# Patient Record
Sex: Female | Born: 1935 | Race: White | Hispanic: No | Marital: Married
Health system: Southern US, Community
[De-identification: ages and names within clinical notes are randomized; demographics above are authoritative.]

## PROBLEM LIST (undated history)

## (undated) DIAGNOSIS — S065XAA Traumatic subdural hemorrhage with loss of consciousness status unknown, initial encounter: Secondary | ICD-10-CM

## (undated) DIAGNOSIS — D649 Anemia, unspecified: Secondary | ICD-10-CM

## (undated) DIAGNOSIS — I509 Heart failure, unspecified: Secondary | ICD-10-CM

## (undated) DIAGNOSIS — I10 Essential (primary) hypertension: Secondary | ICD-10-CM

## (undated) HISTORY — DX: Anemia, unspecified: D64.9

## (undated) HISTORY — DX: Traumatic subdural hemorrhage with loss of consciousness status unknown, initial encounter: S06.5XAA

## (undated) HISTORY — DX: Heart failure, unspecified: I50.9

---

## 2012-05-12 ENCOUNTER — Ambulatory Visit: Payer: Self-pay | Admitting: Internal Medicine

## 2021-06-29 ENCOUNTER — Emergency Department: Payer: Medicare Other

## 2021-06-29 ENCOUNTER — Inpatient Hospital Stay
Admission: EM | Admit: 2021-06-29 | Discharge: 2021-07-01 | DRG: 083 | Disposition: A | Discharge status: Home Health | Payer: Medicare Other | Attending: Internal Medicine | Admitting: Internal Medicine

## 2021-06-29 ENCOUNTER — Encounter: Payer: Self-pay | Admitting: Internal Medicine

## 2021-06-29 DIAGNOSIS — I6200 Nontraumatic subdural hemorrhage, unspecified: Secondary | ICD-10-CM | POA: Diagnosis not present

## 2021-06-29 DIAGNOSIS — I10 Essential (primary) hypertension: Secondary | ICD-10-CM

## 2021-06-29 DIAGNOSIS — Z823 Family history of stroke: Secondary | ICD-10-CM

## 2021-06-29 DIAGNOSIS — Z87891 Personal history of nicotine dependence: Secondary | ICD-10-CM

## 2021-06-29 DIAGNOSIS — Y92009 Unspecified place in unspecified non-institutional (private) residence as the place of occurrence of the external cause: Secondary | ICD-10-CM | POA: Diagnosis not present

## 2021-06-29 DIAGNOSIS — D72829 Elevated white blood cell count, unspecified: Secondary | ICD-10-CM | POA: Diagnosis present

## 2021-06-29 DIAGNOSIS — W19XXXA Unspecified fall, initial encounter: Secondary | ICD-10-CM

## 2021-06-29 DIAGNOSIS — G8194 Hemiplegia, unspecified affecting left nondominant side: Secondary | ICD-10-CM | POA: Diagnosis present

## 2021-06-29 DIAGNOSIS — R29810 Facial weakness: Secondary | ICD-10-CM | POA: Diagnosis present

## 2021-06-29 DIAGNOSIS — S066XAA Traumatic subarachnoid hemorrhage with loss of consciousness status unknown, initial encounter: Secondary | ICD-10-CM | POA: Diagnosis not present

## 2021-06-29 DIAGNOSIS — Z79899 Other long term (current) drug therapy: Secondary | ICD-10-CM

## 2021-06-29 DIAGNOSIS — R4701 Aphasia: Secondary | ICD-10-CM | POA: Diagnosis present

## 2021-06-29 DIAGNOSIS — I609 Nontraumatic subarachnoid hemorrhage, unspecified: Secondary | ICD-10-CM

## 2021-06-29 DIAGNOSIS — S065XAA Traumatic subdural hemorrhage with loss of consciousness status unknown, initial encounter: Secondary | ICD-10-CM | POA: Diagnosis present

## 2021-06-29 DIAGNOSIS — Z66 Do not resuscitate: Secondary | ICD-10-CM | POA: Diagnosis present

## 2021-06-29 DIAGNOSIS — R471 Dysarthria and anarthria: Secondary | ICD-10-CM | POA: Diagnosis present

## 2021-06-29 DIAGNOSIS — R569 Unspecified convulsions: Secondary | ICD-10-CM

## 2021-06-29 DIAGNOSIS — R4182 Altered mental status, unspecified: Secondary | ICD-10-CM | POA: Diagnosis present

## 2021-06-29 DIAGNOSIS — E871 Hypo-osmolality and hyponatremia: Secondary | ICD-10-CM

## 2021-06-29 DIAGNOSIS — Z20822 Contact with and (suspected) exposure to covid-19: Secondary | ICD-10-CM | POA: Diagnosis present

## 2021-06-29 HISTORY — DX: Essential (primary) hypertension: I10

## 2021-06-29 LAB — CBC
HCT: 34 % — ABNORMAL LOW (ref 36.0–46.0)
Hemoglobin: 11 g/dL — ABNORMAL LOW (ref 12.0–15.0)
MCH: 27.8 pg (ref 26.0–34.0)
MCHC: 32.4 g/dL (ref 30.0–36.0)
MCV: 86.1 fL (ref 80.0–100.0)
Platelets: 376 10*3/uL (ref 150–400)
RBC: 3.95 MIL/uL (ref 3.87–5.11)
RDW: 15.1 % (ref 11.5–15.5)
WBC: 11.4 10*3/uL — ABNORMAL HIGH (ref 4.0–10.5)
nRBC: 0 % (ref 0.0–0.2)

## 2021-06-29 LAB — COMPREHENSIVE METABOLIC PANEL
ALT: 6 U/L (ref 0–44)
AST: 27 U/L (ref 15–41)
Albumin: 3 g/dL — ABNORMAL LOW (ref 3.5–5.0)
Alkaline Phosphatase: 69 U/L (ref 38–126)
Anion gap: 9 (ref 5–15)
BUN: 26 mg/dL — ABNORMAL HIGH (ref 8–23)
CO2: 25 mmol/L (ref 22–32)
Calcium: 9.5 mg/dL (ref 8.9–10.3)
Chloride: 98 mmol/L (ref 98–111)
Creatinine, Ser: 0.67 mg/dL (ref 0.44–1.00)
GFR, Estimated: >60 mL/min (ref >60)
Glucose, Bld: 130 mg/dL — ABNORMAL HIGH (ref 70–99)
Potassium: 5.1 mmol/L (ref 3.5–5.1)
Sodium: 132 mmol/L — ABNORMAL LOW (ref 135–145)
Total Bilirubin: 0.9 mg/dL (ref 0.3–1.2)
Total Protein: 8.6 g/dL — ABNORMAL HIGH (ref 6.5–8.1)

## 2021-06-29 LAB — DIFFERENTIAL
Abs Immature Granulocytes: 0.05 10*3/uL (ref 0.00–0.07)
Basophils Absolute: 0.1 10*3/uL (ref 0.0–0.1)
Basophils Relative: 1 %
Eosinophils Absolute: 0.1 10*3/uL (ref 0.0–0.5)
Eosinophils Relative: 1 %
Immature Granulocytes: 0 %
Lymphocytes Relative: 25 %
Lymphs Abs: 2.9 10*3/uL (ref 0.7–4.0)
Monocytes Absolute: 1.6 10*3/uL — ABNORMAL HIGH (ref 0.1–1.0)
Monocytes Relative: 14 %
Neutro Abs: 6.7 10*3/uL (ref 1.7–7.7)
Neutrophils Relative %: 59 %

## 2021-06-29 LAB — PROTIME-INR
INR: 1.2 (ref 0.8–1.2)
Prothrombin Time: 14.9 seconds (ref 11.4–15.2)

## 2021-06-29 LAB — APTT: aPTT: 40 seconds — ABNORMAL HIGH (ref 24–36)

## 2021-06-29 IMAGING — CT CT HEAD CODE STROKE
4 series · 16 of 47 positions shown, 18 images · non-contrast
Comparison: No pertinent prior exams available for comparison.

CLINICAL DATA: Code stroke. Neuro deficit, acute, stroke suspected.
Additional history provided: Slurred speech.

EXAM:
CT HEAD WITHOUT CONTRAST
TECHNIQUE: Contiguous axial images were obtained from the base of the skull
through the vertex without intravenous contrast.

[Series 3: head bone · axial · 0.41mm/px · z∈[+432,+462]mm · 3 of 76 slices shown]
[im 8/76  bone]
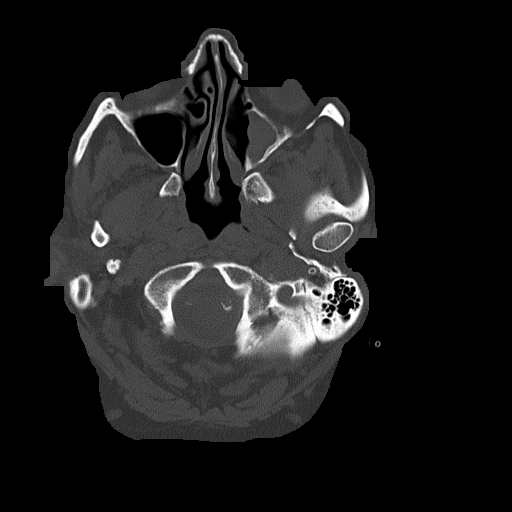
[im 16/76  bone]
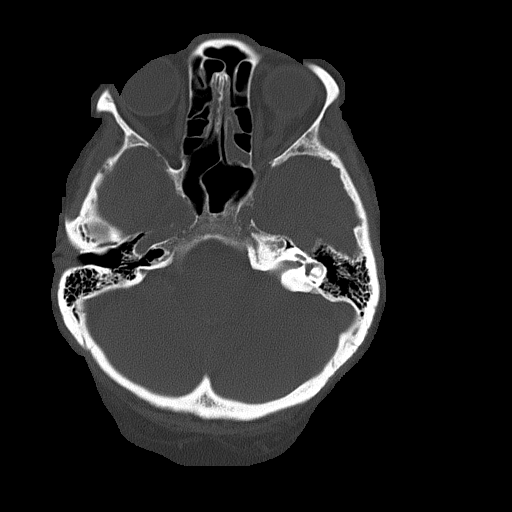
[im 23/76  bone]
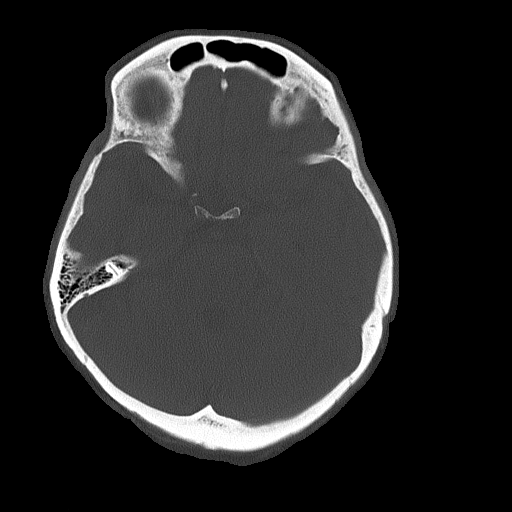

[Series 4: head wo · axial · 0.41mm/px · z∈[+433,+548]mm · 7 of 31 slices shown, 9 images]
[im 4/31  brain]
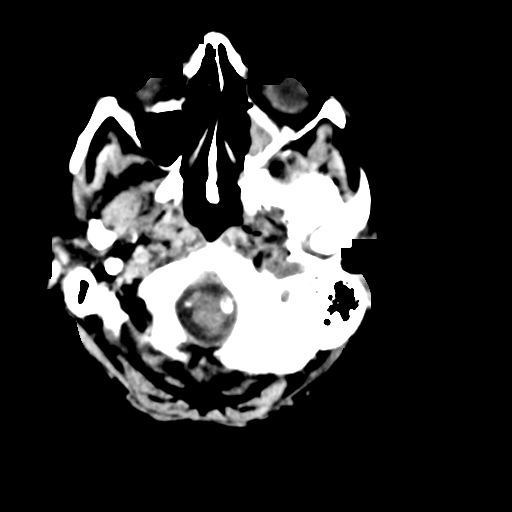
[im 4/31  bone]
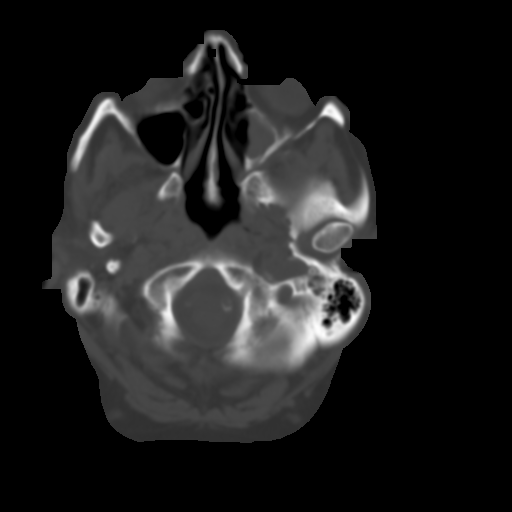
[im 8/31  brain]
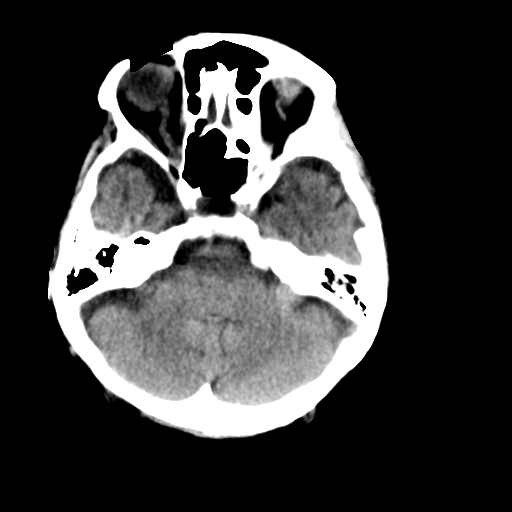
[im 12/31  brain]
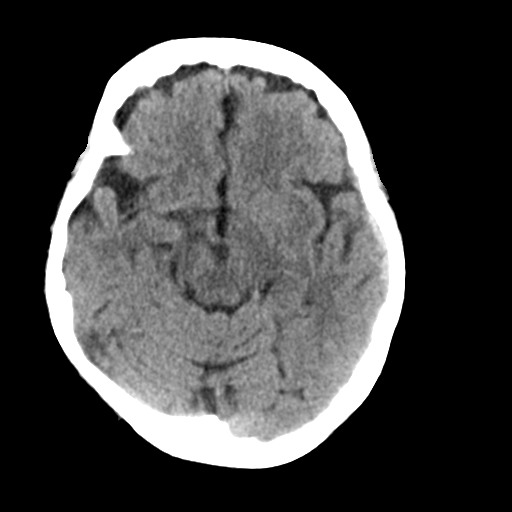
[im 16/31  brain]
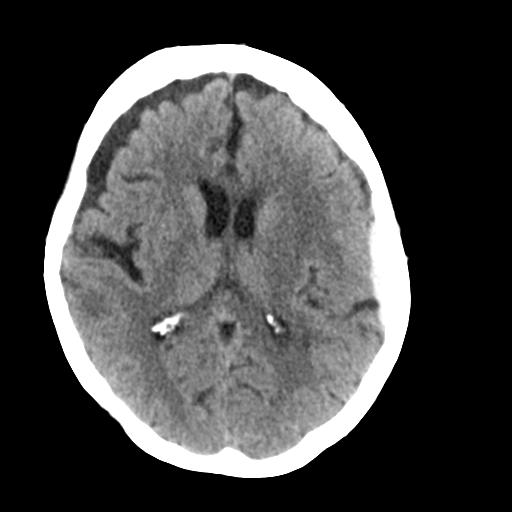
[im 19/31  brain]
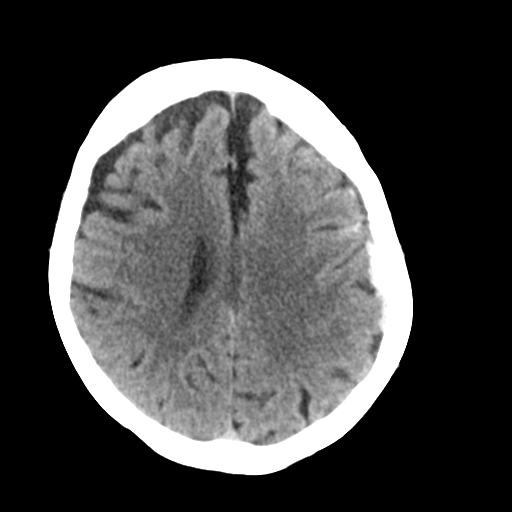
[im 19/31  bone]
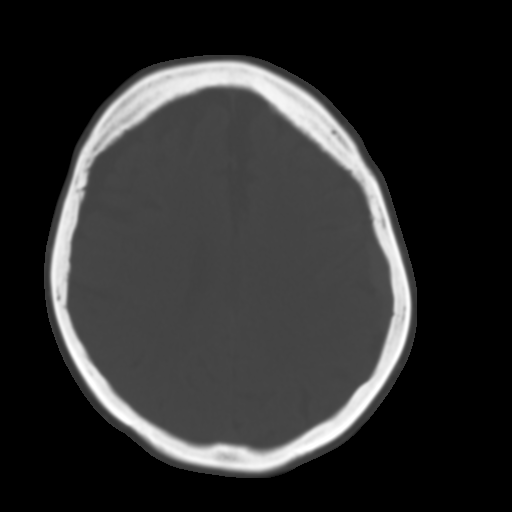
[im 23/31  brain]
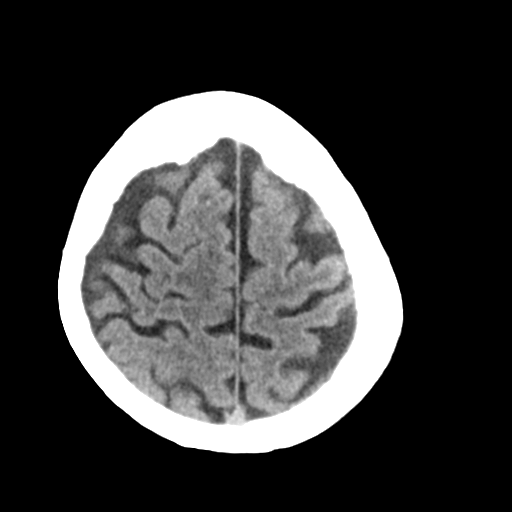
[im 27/31  brain]
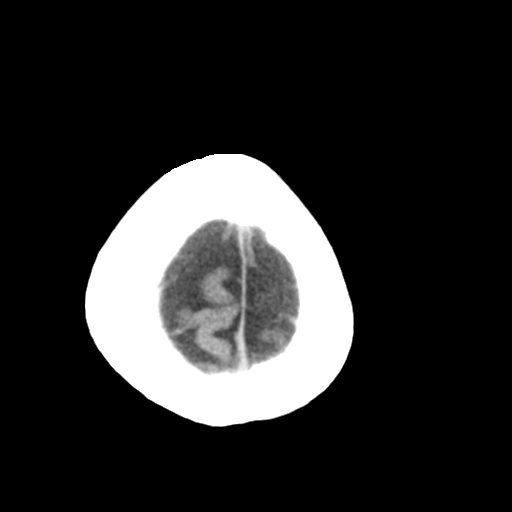

[Series 5: coronal soft tissue · coronal · 0.31mm/px · 3 of 63 slices shown]
[im 24/63  brain]
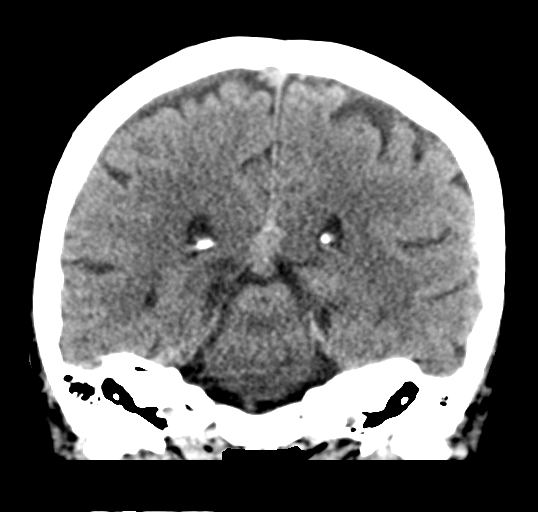
[im 29/63  brain]
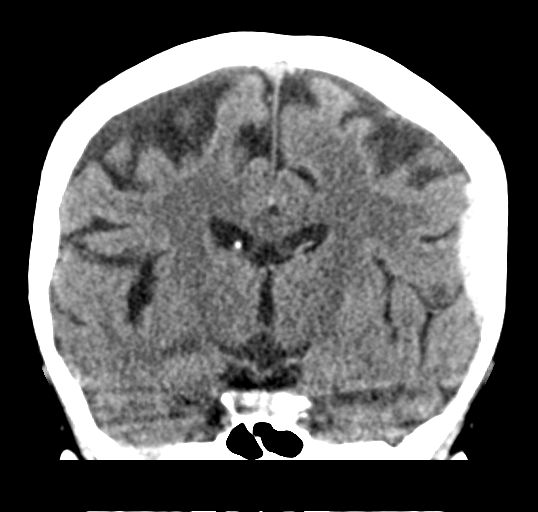
[im 34/63  brain]
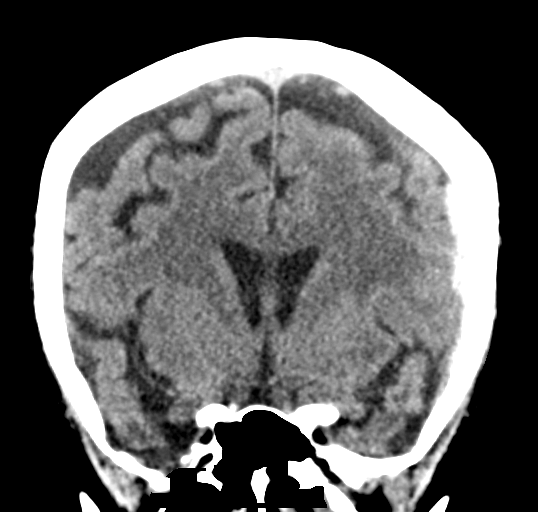

[Series 6: sagittal soft tissue · sagittal · 0.31mm/px · 3 of 55 slices shown]
[im 19/55  brain]
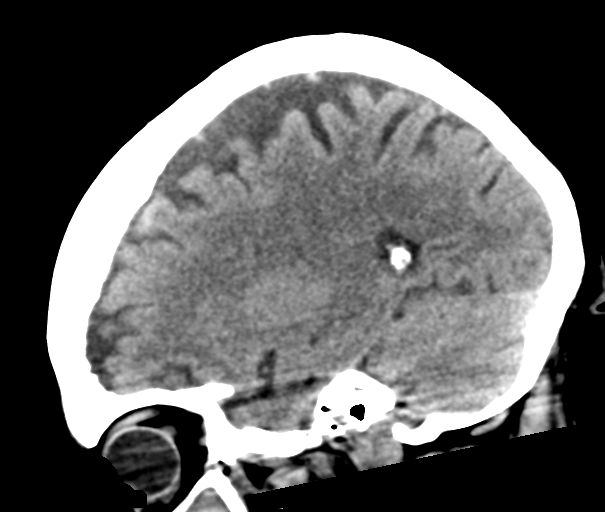
[im 28/55  brain]
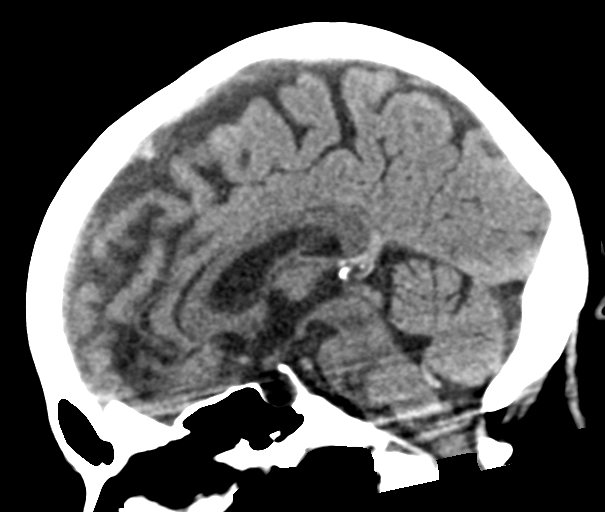
[im 37/55  brain]
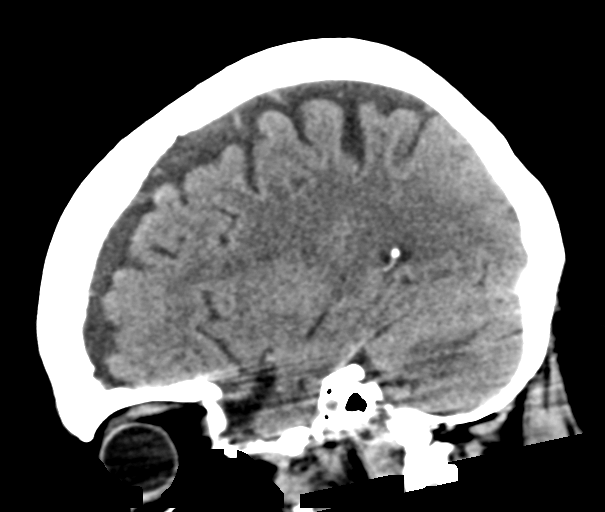

[16 of 47 positions shown; findings below may reference images not displayed]

FINDINGS: Brain:

Mild generalized cerebral atrophy.

Acute extra-axial hemorrhage (favored subdural) overlying the left
cerebral hemisphere, greatest overlying the left frontal and
parietal lobes, measuring up to 6 mm in thickness. Minimal mass
effect upon the underlying left cerebral hemisphere. No midline
shift. Adjacent small volume acute subarachnoid hemorrhage overlying
the left frontal lobe.

No demarcated cortical infarct.

No extra-axial fluid collection.

No evidence of an intracranial mass.

No midline shift.

Vascular: No hyperdense vessel.  Atherosclerotic calcifications.

Skull: Normal. Negative for fracture or focal lesion.

Sinuses/Orbits: Visualized orbits show no acute finding. Complete
opacification of the left maxillary sinus at the imaged levels.
Small right sphenoid sinus mucous retention cyst. Partial
opacification of the left ethmoid air cells.

ASPECTS (Alberta Stroke Program Early CT Score)

- Ganglionic level infarction (caudate, lentiform nuclei, internal
capsule, insula, M1-M3 cortex): 7

- Supraganglionic infarction (M4-M6 cortex): 3

Total score (0-10 with 10 being normal): 10

These results were called by telephone at the time of interpretation
on [DATE] at [DATE] to provider RI[facility] , who verbally
acknowledged these results.
IMPRESSION: Acute extra-axial hemorrhage (likely subdural) overlying the left
cerebral hemisphere (greatest overlying the left frontal and
parietal lobes), measuring up to 6 mm in thickness. Minimal mass
effect upon the underlying left cerebral hemisphere without midline
shift.

Adjacent small-volume acute subarachnoid hemorrhage overlying the
left frontal lobe.

No acute demarcated cortical infarct is identified.

Mild generalized cerebral atrophy.

Paranasal sinus disease at the imaged levels, as described.

## 2021-06-29 MED ORDER — FLUTICASONE PROPIONATE 50 MCG/ACT NA SUSP
1.0000 | Freq: Two times a day (BID) | NASAL | Status: DC | PRN
  Filled 2021-06-29: qty 16

## 2021-06-29 MED ORDER — ACETAMINOPHEN 650 MG RE SUPP: 650.0000 mg | Freq: Four times a day (QID) | RECTAL | Status: DC | PRN

## 2021-06-29 MED ORDER — LEVETIRACETAM IN NACL 1000 MG/100ML IV SOLN
1000.0000 mg | Freq: Two times a day (BID) | INTRAVENOUS | Status: DC
  Administered 2021-06-30 – 2021-07-01 (×3): 1000 mg via INTRAVENOUS
  Filled 2021-06-29 (×5): qty 100

## 2021-06-29 MED ORDER — LORAZEPAM 2 MG/ML IJ SOLN: 2.0000 mg | INTRAMUSCULAR | Status: DC | PRN

## 2021-06-29 MED ORDER — SODIUM CHLORIDE 0.9% FLUSH
3.0000 mL | Freq: Once | INTRAVENOUS | Status: AC
  Administered 2021-06-29: 19:00:00 3 mL via INTRAVENOUS

## 2021-06-29 MED ORDER — METOPROLOL TARTRATE 50 MG PO TABS
50.0000 mg | ORAL_TABLET | Freq: Two times a day (BID) | ORAL | Status: DC
  Administered 2021-06-30: 10:00:00 50 mg via ORAL
  Filled 2021-06-29: qty 1

## 2021-06-29 MED ORDER — ACETAMINOPHEN 500 MG PO TABS: 1000.0000 mg | ORAL_TABLET | Freq: Four times a day (QID) | ORAL | Status: DC | PRN

## 2021-06-29 MED ORDER — HYDRALAZINE HCL 20 MG/ML IJ SOLN
5.0000 mg | INTRAMUSCULAR | Status: DC | PRN
Start: 2021-06-29 — End: 2021-07-01

## 2021-06-29 MED ORDER — ONDANSETRON HCL 4 MG/2ML IJ SOLN: 4.0000 mg | Freq: Four times a day (QID) | INTRAMUSCULAR | Status: DC | PRN

## 2021-06-29 MED ORDER — ONDANSETRON HCL 4 MG PO TABS: 4.0000 mg | ORAL_TABLET | Freq: Four times a day (QID) | ORAL | Status: DC | PRN

## 2021-06-29 MED ORDER — LEVETIRACETAM IN NACL 1000 MG/100ML IV SOLN
1000.0000 mg | Freq: Once | INTRAVENOUS | Status: AC
  Administered 2021-06-29: 20:00:00 1000 mg via INTRAVENOUS
  Filled 2021-06-29: qty 100

## 2021-06-29 MED ORDER — DIAZEPAM 5 MG/ML IJ SOLN: 2.5000 mg | Freq: Four times a day (QID) | INTRAMUSCULAR | Status: DC | PRN

## 2021-06-29 NOTE — ED Notes (Signed)
Pt cleared to go to CT by EDP

## 2021-06-29 NOTE — Consult Note (Signed)
NEUROLOGY TELECONSULTATION NOTE   Date of service: June 29, 2021 Patient Name: Mallory Oconnor MRN:  [id number] DOB:  1932-02-09 Reason for consult: L sided weakness and difficulty speaking  Requesting Provider: Dr. Shaune Pollack Consult Participants: myself, patient, Dr. Erma Heritage, bedside RN, telestroke RN Location of the provider: [facility],  Location of the patient: [facility]  This consult was provided via telemedicine with 2-way video and audio communication. The patient/family was informed that care would be provided in this way and agreed to receive care in this manner.   _ _ _   _ __   _ __ _ _  __ __   _ __   __ _  History of Present Illness   [name] is a 87 yo with no past medical history of neurologic disorders not on anticoagulation who is BIB EMS after developing L sided weakness and difficulty finding her words while eating dinner at the dining hall of her ALF. Patient reports that she had a fall while getting into bed a few days ago. She hit her head but did not tell anyone. Then tonight at 1730 she was eating dinner when she began spilling food and drink from the left side of her mouth. She also had word finding difficulty. By the time EMS arrived she had a mild L facial droop and WFD was improving. On my exam NIHSS = 2 for mild L NLF flattening and mild expressive aphasia. Head CT showed acute L subdural up to 29mm thickness w/ minimal mass effect and no midline shift with adjacent small volume SAH overlying L frontal lobe (personal review). TNK not administered 2/2 SDH. Dr. Erma Heritage contacted NSU who recommended repeat head CT in 6 hrs and keppra prophylaxis. SBP in 120s during telestroke.  Patient is v hard of hearing even with her hearing aids in. She says she has a daughter Mallory Oconnor but we do not have her contact information. ED to contact ALF to confirm pmhx, med list, and family contact info.      ROS   Per HPI; all other systems reviewed and are negative  Past History   ED to confirm. Patient unable to provide detailed pmhx.  Medications   ED to confirm. She is not on anticoagulation and does not take aspirin.  Vitals   Vitals:   06/29/21 1833  BP: 120/75  Pulse: 88  Resp: 18  Temp: 98.1 F (36.7 C)  TempSrc: Oral  SpO2: 100%  Height: 5' (1.524 m)     There is no height or weight on file to calculate BMI.  Physical Exam   Exam performed over telemedicine with 2-way video and audio communication and with assistance of bedside RN  Physical Exam Gen: A&O x4, NAD Resp: normal WOB CV: extremities appear well-perfused  Neuro: *MS: A&O x4. Follows multi-step commands.  *Speech: nondysarthric, naming and repetition intact, though mild WFD in conversation with slight speech delay *CN: PERRL 27mm, EOMI, VFF by confrontation, sensation intact, L NLF flattening, hearing intact to voice *Motor:   Normal bulk.  No tremor, rigidity or bradykinesia. No pronator drift. All extremities appear full-strength and symmetric. *Sensory: SILT. Symmetric. No double-simultaneous extinction.  *Coordination:  Finger-to-nose, heel-to-shin, rapid alternating motions were intact. *Reflexes:  UTA 2/2 tele-exam *Gait: deferred  NIHSS = 2 for facial droop and mild expressive aphasia   Premorbid mRS = 2   Labs   CBC:  Recent Labs  Lab 06/29/21 1850  WBC 11.4*  NEUTROABS 6.7  HGB 11.0*  HCT  34.0*  MCV 86.1  PLT 376    Basic Metabolic Panel: No results found for: NA, K, CO2, GLUCOSE, BUN, CREATININE, CALCIUM, GFRNONAA, GFRAA, GLUCOSE Lipid Panel: No results found for: LDLCALC HgbA1c: No results found for: HGBA1C Urine Drug Screen: No results found for: LABOPIA, COCAINSCRNUR, LABBENZ, AMPHETMU, THCU, LABBARB  Alcohol Level No results found for: [facility]    Impression   87 yo not on anticoagulation who had a fall w/ head trauma 2 days ago presented after transient episode of expressive aphasia and L sided weakness 2 hrs ago, now nearly resolved. Head CT  shows SDH with small amt of adjacent SAH likely 2/2 her recent fall. Suspect tonight's event may be 2/2 seizure given near resolution of her deficits. Platelets are normal.  Recommendations   - Admit to hospitalist service. Patient does not require ICU given hear normal neurologic exam, BP at goal without intervention, and trauma occurred 2 days ago. - Not on anticoagulation, no indication for reversal. - SCDs for DVT prophylaxis - Head CT in 6 hrs assess stability. If worsening blood on CT, call neurosurgery. - STAT head CT and teleneuro consult for any decline in neurologic exam - rEEG tomorrow AM - Agree with keppra 1000mg  bid  - HOB elevated 30 degrees - NSU consulted by ED - I will see patient in AM   , MD [facility] [phone]

## 2021-06-29 NOTE — ED Provider Notes (Signed)
[facility] Emergency Department Provider Note  ____________________________________________   Event Date/Time   First MD Initiated Contact with Patient 06/29/21 1812     (approximate)  I have reviewed the triage vital signs and the nursing notes.   HISTORY  Chief Complaint Code Stroke    HPI Mallory Oconnor is a 87 y.o. female  here with acute difficulty speaking and weakness. History provided mostly by EMS. Per report, pt had acute onset of difficulty keeping food in her mouth, as well as L sided weakness, at about 5:30 PM today. She was eating at her facility/ALF at the time. Since then, she has had mild improvement in her weakness but persistent weakness. Mild L facial droop noted with EMS. She states she feels difficulty speaking but feels like her weakness is improving. No other complaints.  Level 5 caveat invoked as remainder of history, ROS, and physical exam limited due to patient's aphasia.         Past Medical History:  Diagnosis Date   Hypertension     Patient Active Problem List   Diagnosis Date Noted   Subdural bleeding (HCC) 06/29/2021   Leukocytosis 06/29/2021   Subdural hematoma    Fall at home, initial encounter    [facility]izure [facility])       Prior to Admission medications   Medication Sig Start Date End Date Taking? Authorizing Provider  ascorbic acid (VITAMIN C) 500 MG tablet Take 1 tablet by mouth daily.   Yes [provider]  Calcium Carb-Cholecalciferol 600-10 MG-MCG TABS Take 1 tablet by mouth 2 (two) times daily.   Yes [provider]  Cholecalciferol 25 MCG (1000 UT) tablet Take 1 tablet by mouth daily.   Yes [provider]  Cinnamon 500 MG capsule Take 1 capsule by mouth daily.   Yes [provider]  Glucosamine-Chondroitin 500-400 MG CAPS Take 1 capsule by mouth daily.   Yes [provider]  hydrochlorothiazide (HYDRODIURIL) 25 MG tablet Take 25 mg by mouth every morning.  05/20/21  Yes [provider]  ipratropium (ATROVENT) 0.03 % nasal spray Place 2 sprays into both nostrils 2 (two) times daily. 01/22/21  Yes [provider]  metoprolol tartrate (LOPRESSOR) 50 MG tablet Take 50 mg by mouth 2 (two) times daily. 04/27/21  Yes [provider]  Multiple Vitamin (MULTIVITAMIN) tablet Take 1 tablet by mouth daily.   Yes [provider]  Multiple Vitamins-Minerals (PRESERVISION AREDS 2 PO) Take 1 tablet by mouth 2 (two) times daily.   Yes [provider]  Melatonin 3 MG TBDP Take 1 tablet by mouth at bedtime as needed. Patient not taking: Reported on 06/29/2021    [provider]  Omega-3 Fatty Acids (FISH OIL) 1000 MG CAPS Take 1 capsule by mouth 2 (two) times daily. Patient not taking: Reported on 06/29/2021    [provider]    Allergies Patient has no allergy information on record.  Family History  Problem Relation Age of Onset   Stroke Maternal Grandmother    Stroke Paternal Grandmother     Social History Social History   Tobacco Use   Smoking status: Former    Types: Cigarettes   Smokeless tobacco: Never  Substance Use Topics   Alcohol use: Not Currently   Drug use: Never    Review of Systems  Review of Systems  Constitutional:  Negative for chills and fever.  HENT:  Negative for sore throat.   Respiratory:  Negative for shortness of breath.  Cardiovascular:  Negative for chest pain.  Gastrointestinal:  Negative for abdominal pain.  Genitourinary:  Negative for flank pain.  Musculoskeletal:  Negative for neck pain.  Skin:  Negative for rash and wound.  Allergic/Immunologic: Negative for immunocompromised state.  Neurological:  Positive for speech difficulty, weakness and numbness.  Hematological:  Does not bruise/bleed easily.  All other systems reviewed and are negative.   ____________________________________________  PHYSICAL EXAM:      VITAL SIGNS: ED Triage Vitals   Enc Vitals Group     BP      Pulse      Resp      Temp      Temp src      SpO2      Weight      Height      Head Circumference      Peak Flow      Pain Score      Pain Loc      Pain Edu?      Excl. in GC?      Physical Exam Vitals and nursing note reviewed.  Constitutional:      General: She is not in acute distress.    Appearance: She is well-developed.  HENT:     Head: Normocephalic and atraumatic.  Eyes:     Conjunctiva/sclera: Conjunctivae normal.  Cardiovascular:     Rate and Rhythm: Normal rate and regular rhythm.     Heart sounds: Normal heart sounds.  Pulmonary:     Effort: Pulmonary effort is normal. No respiratory distress.     Breath sounds: No wheezing.  Abdominal:     General: There is no distension.  Musculoskeletal:     Cervical back: Neck supple.  Skin:    General: Skin is warm.     Capillary Refill: Capillary refill takes less than 2 seconds.     Findings: No rash.  Neurological:     Mental Status: She is alert and oriented to person, place, and time.     Motor: No abnormal muscle tone.     Neurological Exam:  Mental Status: Alert and oriented to person, place, and time. Attention and concentration normal. Speech with slight expressive aphasia noted. Recent memory is intact. Cranial Nerves: Visual fields grossly intact. EOMI and PERRLA. No nystagmus noted. Facial sensation intact at forehead, maxillary cheek, and chin/mandible bilaterally. Subtle left facial droop noted. Hearing grossly normal. Uvula is midline, and palate elevates symmetrically. Normal SCM and trapezius strength. Tongue midline without fasciculations. Motor: Muscle strength 5/5 in proximal and distal UE and LE bilaterally. No pronator drift. Muscle tone normal. Sensation: Intact to light touch in upper and lower extremities distally bilaterally.  Gait: Deferred Coordination: Deferred   ____________________________________________   LABS (all labs ordered are listed, but  only abnormal results are displayed)  Labs Reviewed  APTT - Abnormal; Notable for the following components:      Result Value   aPTT 40 (*)    All other components within normal limits  CBC - Abnormal; Notable for the following components:   WBC 11.4 (*)    Hemoglobin 11.0 (*)    HCT 34.0 (*)    All other components within normal limits  DIFFERENTIAL - Abnormal; Notable for the following components:   Monocytes Absolute 1.6 (*)    All other components within normal limits  COMPREHENSIVE METABOLIC PANEL - Abnormal; Notable for the following components:   Sodium 132 (*)    Glucose, Bld 130 (*)  BUN 26 (*)    Total Protein 8.6 (*)    Albumin 3.0 (*)    All other components within normal limits  RESP PANEL BY RT-PCR (FLU A&B, COVID) ARPGX2  PROTIME-INR  BASIC METABOLIC PANEL  CBC  PHOSPHORUS  MAGNESIUM  TSH  VITAMIN B12  VITAMIN B1  VITAMIN D 25 HYDROXY (VIT D DEFICIENCY, FRACTURES)  CBG MONITORING, ED    ____________________________________________  EKG: Normal sinus rhythm, VR 72. PR 159, QRS 124, QTc 460. No acute ST elevations. Subtle TW elevations diffusely.  ________________________________________  RADIOLOGY All imaging, including plain films, CT scans, and ultrasounds, independently reviewed by me, and interpretations confirmed via formal radiology reads.  ED MD interpretation:   CT Head: Acute extra-axial hemorrhage  Official radiology report(s): CT HEAD CODE STROKE WO CONTRAST  Result Date: 06/29/2021 CLINICAL DATA:  Code stroke. Neuro deficit, acute, stroke suspected. Additional history provided: Slurred speech. EXAM: CT HEAD WITHOUT CONTRAST TECHNIQUE: Contiguous axial images were obtained from the base of the skull through the vertex without intravenous contrast. COMPARISON:  No pertinent prior exams available for comparison. FINDINGS: Brain: Mild generalized cerebral atrophy. Acute extra-axial hemorrhage (favored subdural) overlying the left cerebral  hemisphere, greatest overlying the left frontal and parietal lobes, measuring up to 6 mm in thickness. Minimal mass effect upon the underlying left cerebral hemisphere. No midline shift. Adjacent small volume acute subarachnoid hemorrhage overlying the left frontal lobe. No demarcated cortical infarct. No extra-axial fluid collection. No evidence of an intracranial mass. No midline shift. Vascular: No hyperdense vessel.  Atherosclerotic calcifications. Skull: Normal. Negative for fracture or focal lesion. Sinuses/Orbits: Visualized orbits show no acute finding. Complete opacification of the left maxillary sinus at the imaged levels. Small right sphenoid sinus mucous retention cyst. Partial opacification of the left ethmoid air cells. ASPECTS (Alberta Stroke Program Early CT Score) - Ganglionic level infarction (caudate, lentiform nuclei, internal capsule, insula, M1-M3 cortex): 7 - Supraganglionic infarction (M4-M6 cortex): 3 Total score (0-10 with 10 being normal): 10 These results were called by telephone at the time of interpretation on 06/29/2021 at 6:36 pm to provider Shaune Pollack , who verbally acknowledged these results. IMPRESSION: Acute extra-axial hemorrhage (likely subdural) overlying the left cerebral hemisphere (greatest overlying the left frontal and parietal lobes), measuring up to 6 mm in thickness. Minimal mass effect upon the underlying left cerebral hemisphere without midline shift. Adjacent small-volume acute subarachnoid hemorrhage overlying the left frontal lobe. No acute demarcated cortical infarct is identified. Mild generalized cerebral atrophy. Paranasal sinus disease at the imaged levels, as described. Electronically Signed   By: Jackey Loge D.O.   On: 06/29/2021 18:38    ____________________________________________  PROCEDURES   Procedure(s) performed (including Critical Care):  .Critical Care Performed by: Shaune Pollack, MD Authorized by: Shaune Pollack, MD   Critical  care provider statement:    Critical care time (minutes):  30   Critical care time was exclusive of:  Separately billable procedures and treating other patients   Critical care was necessary to treat or prevent imminent or life-threatening deterioration of the following conditions:  Cardiac failure, circulatory failure and CNS failure or compromise   Critical care was time spent personally by me on the following activities:  Development of treatment plan with patient or surrogate, discussions with consultants, evaluation of patient's response to treatment, examination of patient, ordering and review of laboratory studies, ordering and review of radiographic studies, ordering and performing treatments and interventions, pulse oximetry, re-evaluation of patient's condition and review of old charts  I assumed direction of critical care for this patient from another provider in my specialty: no     Care discussed with: admitting provider    ____________________________________________  INITIAL IMPRESSION / MDM / ASSESSMENT AND PLAN / ED COURSE  As part of my medical decision making, I reviewed the following data within the electronic MEDICAL RECORD NUMBER Nursing notes reviewed and incorporated, Old chart reviewed, Notes from prior ED visits, and [place] Controlled Substance Database       *Mallory Oconnor was evaluated in Emergency Department on 06/29/2021 for the symptoms described in the history of present illness. She was evaluated in the context of the global COVID-19 pandemic, which necessitated consideration that the patient might be at risk for infection with the SARS-CoV-2 virus that causes COVID-19. Institutional protocols and algorithms that pertain to the evaluation of patients at risk for COVID-19 are in a state of rapid change based on information released by regulatory bodies including the CDC and federal and state organizations. These policies and algorithms were followed during the patient's care  in the ED.  Some ED evaluations and interventions may be delayed as a result of limited staffing during the pandemic.*     Medical Decision Making:  87 yo F here with stroke like sx of left facial droop, expressive aphasia, and transient LUE weakness (reported, now resolved). Activated as a CODE STROKE. CT head obtained, reviewed, and shows 6 mm left subdural with small frontal SAH. Suspect partial seizure 2/2 SAH/SDH. Sx rapidly improving in ED. Focality would not necessarily correspond to the area of bleed. Discussed with Dr. Selina Cooley who has seen pt, as well as Dr. Adriana Simas via telephone. Will obtain repeat CT at 6 hr, start Keppra 1g BID for sz tx and ppx. Admit to medicine. If sx persist, may need spot EEG per Neuro/NSGY.  ____________________________________________  FINAL CLINICAL IMPRESSION(S) / ED DIAGNOSES  Final diagnoses:  Subdural hematoma  Subarachnoid hemorrhage (HCC)  Seizure-like activity (HCC)     MEDICATIONS GIVEN DURING THIS VISIT:  Medications  levETIRAcetam (KEPPRA) IVPB 1000 mg/100 mL premix (has no administration in time range)  acetaminophen (TYLENOL) tablet 1,000 mg (has no administration in time range)    Or  acetaminophen (TYLENOL) suppository 650 mg (has no administration in time range)  ondansetron (ZOFRAN) tablet 4 mg (has no administration in time range)    Or  ondansetron (ZOFRAN) injection 4 mg (has no administration in time range)  hydrALAZINE (APRESOLINE) injection 5 mg (has no administration in time range)  LORazepam (ATIVAN) injection 2 mg (has no administration in time range)  diazepam (VALIUM) injection 2.5 mg (has no administration in time range)  metoprolol tartrate (LOPRESSOR) tablet 50 mg (has no administration in time range)  sodium chloride flush (NS) 0.9 % injection 3 mL (3 mLs Intravenous Given 06/29/21 1856)  levETIRAcetam (KEPPRA) IVPB 1000 mg/100 mL premix (0 mg Intravenous Stopped 06/29/21 2037)     ED Discharge Orders     None         Note:  This document was prepared using Dragon voice recognition software and may include unintentional dictation errors.   Shaune Pollack, MD 06/29/21 2151

## 2021-06-29 NOTE — Progress Notes (Signed)
CH visited patient in ED8. Patient was alert and awake upon arrival. Able to engage [facility] but responses were limited to 1-2 words. Denied pain. Daughter has been present but stepped out for a meal. Built rapport with patient. No needs were expressed.

## 2021-06-29 NOTE — ED Notes (Signed)
Phlebotomy at bedside.

## 2021-06-29 NOTE — H&P (Addendum)
History and Physical   Mallory Oconnor [id number] DOB: [birth year] DOA: 06/29/2021  PCP: System, Provider Not In  Patient coming from: [facility]/assisted living facility  I have personally briefly reviewed patient's old medical records in [place].  Chief Concern: Expressive aphasia and weakness  HPI: Mallory Oconnor is a 87 y.o. female with medical history significant for hypertension, from [facility], assisted living facility who presents to [facility] for chief concerns of expressive aphasia and left upper extremity weakness during dinner.  At bedside, she is able to tell me her full name, her age, current calendar year, her current location and where she lives.  She does not appear to be in acute distress.  She asked me for an additional blanket as she feels like the single-point is not sufficient and she still feels cold.  [name] states that a few days ago, possibly Saturday, she fell and hit her head however she did not tell anyone when she hit her head. she states she did not tell anyone because her head was not bleeding.  She states that she was trying to get up on her bed in the assisted living facility when she fell backwards.  She thinks that her bed may be too high.  Today, patient was eating dinner at approximately 1730, she began spilling her food and drink from the left side of her mouth.  She tried putting food to eat and the food and water spilled out of her mouth again.  She also endorses word finding difficulty.    Patient is not on a blood thinner.  Discussed with daughter, Emmie Niemann to ensure that the bed is at the perfect height that is not too low and not too high in order to facilitate easy access.  Family to consider pushing a queen size bed against 1 wall in order to mitigate falling off the bed.  Social history: She currently lives at ALF, [facility]. She is a former tobacco user and quit more than 30 years ago. She denies etoh and recreatioanl drug  use. 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   [name] states that her daughter, Mallory Oconnor, is her health care power of attorney and that her daugher knows she is here and will update the rest of her children.  She also further states that her daughter is already aware of the situation and knows the current plan.  Vaccination history: She states she has received 4 covid 19, she thinks it is Pfizer   ROS: Constitutional: no weight change, no fever ENT/Mouth: no sore throat, no rhinorrhea Eyes: no eye pain, no vision changes Cardiovascular: no chest pain, no dyspnea,  no edema, no palpitations Respiratory: no cough, no sputum, no wheezing Gastrointestinal: no nausea, no vomiting, no diarrhea, no constipation Genitourinary: no urinary incontinence, no dysuria, no hematuria Musculoskeletal: no arthralgias, no myalgias Skin: no skin lesions, no pruritus, Neuro: + weakness, no loss of consciousness, no syncope Psych: no anxiety, no depression, + decrease appetite Heme/Lymph: no bruising, no bleeding  ED Course: Discussed with emergency medicine provider, patient requiring hospitalization for chief concerns of acute extra-axial hemorrhage, suspect subdural overlying the left hemisphere and possible seizure.  Vitals in the emergency department showed temperature of 98.1, respiration rate of 25, improved to 19, heart rate of 71, improved to 65, blood pressure 128/101, SPO2 of 99% on room air.  Labs in the emergency department showed serum sodium 132, potassium 5.1, chloride 98, bicarb 25, BUN of 26, serum creatinine of 0.67,  nonfasting blood glucose 130, GFR greater than 60.  WBC elevated at 11.4, hemoglobin 11, platelets 376, PTT was 40, PT was 14.9, INR 1.2.  EDP consulted neurosurgery and neurology.  Neurosurgery recommends repeat CT of the head in 6 hours which has been ordered by EDP.  Assessment/Plan  Principal Problem:   Subdural bleeding [facility]) Active Problems:   Subdural hematoma   Fall at home,  initial encounter   Seizure ([place])   Leukocytosis   # Subdural bleeding-presumed secondary to falling and hitting her head a few days ago - Repeat CT of the head ordered by EDP - Status post loading dose of Keppra 1 g IV ordered and given in the emergency department - Neurosurgery recommends Keppra 1000 mg IV twice daily, scheduled for 06/30/2021 by EDP - Blood pressure control: Patient is currently normotensive - Hydralazine 5 mg IV every 4 hours as needed for SBP greater than 160, 2 days ordered - Neurologist recommends admission, neurology will follow in the a.m. and if needed patient with get a spot EEG - Epic order, routine, placed for neurosurgery, Dr. Lacinda Axon placed - Epic order, routine, neurology, Dr. Quinn Axe place - Adult EEG ordered for 07/01/2019 2 in the AM - Fall precaution, seizure precautions, aspiration precaution - Admit to progressive cardiac, observation, with telemetry  # Possible seizure activity-status post Keppra 1 g loading dose per recommendation of neurosurgeon via EDP - Continue keppra 1000 mg IV twice daily - Lorazepam 2 mg IV as needed, for seizures, 2 doses ordered with instructions to alert provider if used for seizures  # Hypertension-resumed metoprolol 50 mg p.o. twice daily per patient on 06/30/2021 - Hydralazine 5 mg IV every 4 hours as needed for SBP greater than 160, 2 days ordered  # Medication reconciliation is in process -  - Patient states she takes metoprolol twice a day, she thinks it is 50 mg - Patient states she takes hydrochlorothiazide 20 mg daily - She states that she has taken her medications today already - Still pending med reconciliation from pharmacy department  # Weakness and imbalance-check B12, B1, vitamin D, TSH  # CODE STATUS discussion: I asked patient if her heart were to stop beating, which she allow [place] to do chest compression.  She looked directly at me and states: "If my heart stops beating, that is it, I do not want any  compressions or resuscitative effort. " I asked patient, if she cannot breathe on her own and we have tried all external oxygen supplementation with out benefit, does she give [place] permission to intubate her, putting a tube down her throat for, hook her up to a ventilator to help breathe for her?   She responded, "no, I don't want to be on a ventilator.' - Daughter was at bedside, I confirmed that this with daughter, Emmie Niemann  Chart reviewed.   DVT prophylaxis: TED hose Code Status: dnr/dni Diet: N.p.o. except for sips with meds Family Communication: Updated daughter, Emmie Niemann, healthcare power of attorney Disposition Plan: Pending clinical course Consults called: EDP consulted neurosurgery, neurology Admission status: Progressive cardiac, observation, telemetry  History reviewed. No pertinent past medical history.  History reviewed. No pertinent surgical history.  Social History:  reports that she has quit smoking. Her smoking use included cigarettes. She has never used smokeless tobacco. She reports that she does not currently use alcohol. She reports that she does not use drugs.  Not on File Family History  Problem Relation Age of Onset   Stroke Maternal  Grandmother    Stroke Paternal Grandmother    Family history: Family history reviewed and not pertinent  Prior to Admission medications   Medication Sig Start Date End Date Taking? Authorizing Provider  hydrochlorothiazide (HYDRODIURIL) 25 MG tablet Take 25 mg by mouth every morning. 05/20/21   [provider]  ipratropium (ATROVENT) 0.03 % nasal spray Place 2 sprays into both nostrils 2 (two) times daily. 01/22/21   [provider]  metoprolol tartrate (LOPRESSOR) 50 MG tablet Take 50 mg by mouth 2 (two) times daily. 04/27/21   [provider]   Physical Exam: Vitals:   06/29/21 1833 06/29/21 1900  BP: 120/75 (!) 128/101  Pulse: 88 71  Resp: 18 (!) 25  Temp: 98.1 F (36.7 C)   TempSrc: Oral    SpO2: 100% 99%  Height: 5' (1.524 m)    Constitutional: appears age-appropriate, frail, NAD, calm, comfortable Eyes: PERRL, lids and conjunctivae normal ENMT: Mucous membranes are moist. Posterior pharynx clear of any exudate or lesions. Age-appropriate dentition. Hearing appropriate Neck: normal, supple, no masses, no thyromegaly Respiratory: clear to auscultation bilaterally, no wheezing, no crackles. Normal respiratory effort. No accessory muscle use.  Cardiovascular: Regular rate and rhythm, no murmurs / rubs / gallops. No extremity edema. 2+ pedal pulses. No carotid bruits.  Abdomen: no tenderness, no masses palpated, no hepatosplenomegaly. Bowel sounds positive.  Musculoskeletal: no clubbing / cyanosis. No joint deformity upper and lower extremities. Good ROM, no contractures, no atrophy. Normal muscle tone.  Skin: no rashes, lesions, ulcers. No induration Neurologic: Sensation intact. Strength 5/5 in all 4.  Psychiatric: Normal judgment and insight. Alert and oriented x 3. Normal mood.   EKG: independently reviewed, showing sinus rhythm with rate of 72, QTc 460, left bundle branch  Chest x-ray on Admission: I personally reviewed and I agree with radiologist reading as below.  CT HEAD CODE STROKE WO CONTRAST  Result Date: 06/29/2021 CLINICAL DATA:  Code stroke. Neuro deficit, acute, stroke suspected. Additional history provided: Slurred speech. EXAM: CT HEAD WITHOUT CONTRAST TECHNIQUE: Contiguous axial images were obtained from the base of the skull through the vertex without intravenous contrast. COMPARISON:  No pertinent prior exams available for comparison. FINDINGS: Brain: Mild generalized cerebral atrophy. Acute extra-axial hemorrhage (favored subdural) overlying the left cerebral hemisphere, greatest overlying the left frontal and parietal lobes, measuring up to 6 mm in thickness. Minimal mass effect upon the underlying left cerebral hemisphere. No midline shift. Adjacent small  volume acute subarachnoid hemorrhage overlying the left frontal lobe. No demarcated cortical infarct. No extra-axial fluid collection. No evidence of an intracranial mass. No midline shift. Vascular: No hyperdense vessel.  Atherosclerotic calcifications. Skull: Normal. Negative for fracture or focal lesion. Sinuses/Orbits: Visualized orbits show no acute finding. Complete opacification of the left maxillary sinus at the imaged levels. Small right sphenoid sinus mucous retention cyst. Partial opacification of the left ethmoid air cells. ASPECTS (Alberta Stroke Program Early CT Score) - Ganglionic level infarction (caudate, lentiform nuclei, internal capsule, insula, M1-M3 cortex): 7 - Supraganglionic infarction (M4-M6 cortex): 3 Total score (0-10 with 10 being normal): 10 These results were called by telephone at the time of interpretation on 06/29/2021 at 6:36 pm to provider Shaune Pollack , who verbally acknowledged these results. IMPRESSION: Acute extra-axial hemorrhage (likely subdural) overlying the left cerebral hemisphere (greatest overlying the left frontal and parietal lobes), measuring up to 6 mm in thickness. Minimal mass effect upon the underlying left cerebral hemisphere without midline shift. Adjacent small-volume acute subarachnoid hemorrhage overlying the  left frontal lobe. No acute demarcated cortical infarct is identified. Mild generalized cerebral atrophy. Paranasal sinus disease at the imaged levels, as described. Electronically Signed   By: Kellie Simmering D.O.   On: 06/29/2021 18:38    Labs on Admission: I have personally reviewed following labs  CBC: Recent Labs  Lab 06/29/21 1850  WBC 11.4*  NEUTROABS 6.7  HGB 11.0*  HCT 34.0*  MCV 86.1  PLT [id number]   Basic Metabolic Panel: Recent Labs  Lab 06/29/21 1850  NA 132*  K 5.1  CL 98  CO2 25  GLUCOSE 130*  BUN 26*  CREATININE 0.67  CALCIUM 9.5   GFR: CrCl cannot be calculated (Unknown ideal weight.).  Liver Function  Tests: Recent Labs  Lab 06/29/21 1850  AST 27  ALT 6  ALKPHOS 69  BILITOT 0.9  PROT 8.6*  ALBUMIN 3.0*   Coagulation Profile: Recent Labs  Lab 06/29/21 1850  INR 1.2   Dr. Tobie Poet [facility]  If 7PM-7AM, please contact overnight-coverage provider If 7AM-7PM, please contact day coverage provider [email or web address]  06/29/2021, 9:29 PM

## 2021-06-29 NOTE — ED Triage Notes (Signed)
Pt to ED via [facility] from [facility]. Pt LWK at 5:30pm when she was going to take a drink of water and started having left sided weakness/numbness, slurred speech and expressive aphasia. Pt stating had a fall a few weeks ago. Pt denies blood thinners. CBG 179.   Notable left sided facial droop and left sided weakness on arrival. Pt alert to self.

## 2021-06-29 NOTE — ED Notes (Addendum)
CBG 169. 

## 2021-06-30 ENCOUNTER — Other Ambulatory Visit: Payer: Self-pay

## 2021-06-30 ENCOUNTER — Observation Stay: Payer: Medicare Other

## 2021-06-30 DIAGNOSIS — Z823 Family history of stroke: Secondary | ICD-10-CM | POA: Diagnosis not present

## 2021-06-30 DIAGNOSIS — Z87891 Personal history of nicotine dependence: Secondary | ICD-10-CM | POA: Diagnosis not present

## 2021-06-30 DIAGNOSIS — Z20822 Contact with and (suspected) exposure to covid-19: Secondary | ICD-10-CM | POA: Diagnosis present

## 2021-06-30 DIAGNOSIS — Y92009 Unspecified place in unspecified non-institutional (private) residence as the place of occurrence of the external cause: Secondary | ICD-10-CM | POA: Diagnosis not present

## 2021-06-30 DIAGNOSIS — I6200 Nontraumatic subdural hemorrhage, unspecified: Secondary | ICD-10-CM | POA: Diagnosis not present

## 2021-06-30 DIAGNOSIS — I10 Essential (primary) hypertension: Secondary | ICD-10-CM | POA: Diagnosis present

## 2021-06-30 DIAGNOSIS — S066XAA Traumatic subarachnoid hemorrhage with loss of consciousness status unknown, initial encounter: Secondary | ICD-10-CM | POA: Diagnosis present

## 2021-06-30 DIAGNOSIS — E871 Hypo-osmolality and hyponatremia: Secondary | ICD-10-CM | POA: Diagnosis present

## 2021-06-30 DIAGNOSIS — G8194 Hemiplegia, unspecified affecting left nondominant side: Secondary | ICD-10-CM | POA: Diagnosis present

## 2021-06-30 DIAGNOSIS — Z66 Do not resuscitate: Secondary | ICD-10-CM | POA: Diagnosis present

## 2021-06-30 DIAGNOSIS — R569 Unspecified convulsions: Secondary | ICD-10-CM | POA: Diagnosis present

## 2021-06-30 DIAGNOSIS — R4182 Altered mental status, unspecified: Secondary | ICD-10-CM | POA: Diagnosis present

## 2021-06-30 DIAGNOSIS — W19XXXA Unspecified fall, initial encounter: Secondary | ICD-10-CM | POA: Diagnosis present

## 2021-06-30 DIAGNOSIS — R4701 Aphasia: Secondary | ICD-10-CM | POA: Diagnosis present

## 2021-06-30 DIAGNOSIS — R471 Dysarthria and anarthria: Secondary | ICD-10-CM | POA: Diagnosis present

## 2021-06-30 DIAGNOSIS — I609 Nontraumatic subarachnoid hemorrhage, unspecified: Secondary | ICD-10-CM | POA: Diagnosis present

## 2021-06-30 DIAGNOSIS — R29810 Facial weakness: Secondary | ICD-10-CM | POA: Diagnosis present

## 2021-06-30 DIAGNOSIS — Z79899 Other long term (current) drug therapy: Secondary | ICD-10-CM | POA: Diagnosis not present

## 2021-06-30 DIAGNOSIS — S065XAA Traumatic subdural hemorrhage with loss of consciousness status unknown, initial encounter: Secondary | ICD-10-CM | POA: Diagnosis present

## 2021-06-30 DIAGNOSIS — D72829 Elevated white blood cell count, unspecified: Secondary | ICD-10-CM | POA: Diagnosis present

## 2021-06-30 LAB — CBC
HCT: 34.1 % — ABNORMAL LOW (ref 36.0–46.0)
Hemoglobin: 10.9 g/dL — ABNORMAL LOW (ref 12.0–15.0)
MCH: 27.3 pg (ref 26.0–34.0)
MCHC: 32 g/dL (ref 30.0–36.0)
MCV: 85.3 fL (ref 80.0–100.0)
Platelets: 370 10*3/uL (ref 150–400)
RBC: 4 MIL/uL (ref 3.87–5.11)
RDW: 15.2 % (ref 11.5–15.5)
WBC: 11.2 10*3/uL — ABNORMAL HIGH (ref 4.0–10.5)
nRBC: 0 % (ref 0.0–0.2)

## 2021-06-30 LAB — BASIC METABOLIC PANEL
Anion gap: 9 (ref 5–15)
BUN: 22 mg/dL (ref 8–23)
CO2: 28 mmol/L (ref 22–32)
Calcium: 9.5 mg/dL (ref 8.9–10.3)
Chloride: 98 mmol/L (ref 98–111)
Creatinine, Ser: 0.58 mg/dL (ref 0.44–1.00)
GFR, Estimated: >60 mL/min (ref >60)
Glucose, Bld: 141 mg/dL — ABNORMAL HIGH (ref 70–99)
Potassium: 4.3 mmol/L (ref 3.5–5.1)
Sodium: 135 mmol/L (ref 135–145)

## 2021-06-30 LAB — MAGNESIUM: Magnesium: 2.2 mg/dL (ref 1.7–2.4)

## 2021-06-30 LAB — VITAMIN D 25 HYDROXY (VIT D DEFICIENCY, FRACTURES): Vit D, 25-Hydroxy: 46.31 ng/mL (ref 30–100)

## 2021-06-30 LAB — TSH: TSH: 4.007 u[IU]/mL (ref 0.350–4.500)

## 2021-06-30 LAB — RESP PANEL BY RT-PCR (FLU A&B, COVID) ARPGX2
Influenza A by PCR: NEGATIVE
Influenza B by PCR: NEGATIVE
SARS Coronavirus 2 by RT PCR: NEGATIVE

## 2021-06-30 LAB — PHOSPHORUS: Phosphorus: 3.1 mg/dL (ref 2.5–4.6)

## 2021-06-30 LAB — VITAMIN B12: Vitamin B-12: 707 pg/mL (ref 180–914)

## 2021-06-30 IMAGING — MR MR HEAD WO/W CM
14 series · 48 of 48 positions shown · IV contrast (gadavist)
Comparison: CT head [DATE].

CLINICAL DATA: Neuro deficit, acute, stroke suspected; Carotid
artery stenosis screening, risk factors; Transient ischemic attack
(TIA) Seizure, new-onset, history of trauma Stroke, follow up Neuro
deficit, acute, stroke suspected

EXAM:
MRI HEAD WITHOUT AND WITH CONTRAST
MRA HEAD WITHOUT CONTRAST
MRA OF THE NECK WITHOUT AND WITH CONTRAST
TECHNIQUE: Multiplanar, multi-echo pulse sequences of the brain and surrounding
structures were acquired without and with intravenous contrast.
Angiographic images of the Circle of Willis were acquired using MRA
technique intravenous contrast. Angiographic images of the neck were
acquired using MRA technique without and with intravenous contrast.
Carotid stenosis measurements (when applicable) are obtained
utilizing NASCET criteria, using the distal internal carotid
diameter as the denominator.
CONTRAST:  7mL GADAVIST GADOBUTROL 1 MMOL/ML IV SOLN

[Series 5: ax dwi_tracew · axial · 3.0mm · 1.80mm/px · z∈[-80,+74]mm · 3 of 48 slices shown]
[im 1/48]
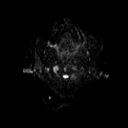
[im 24/48]
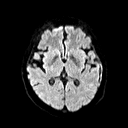
[im 48/48]
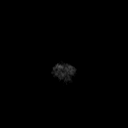

[Series 6: ax dwi_adc · axial · 3.0mm · 1.80mm/px · z∈[-80,+74]mm · 3 of 47 slices shown]
[im 1/47]
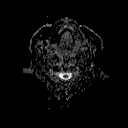
[im 24/47]
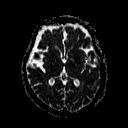
[im 47/47]
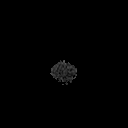

[Series 7: cor dwi_tracew · coronal · 5.0mm · 1.80mm/px · 2 of 38 slices shown]
[im 1/38]
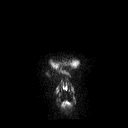
[im 38/38]
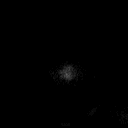

[Series 8: cor dwi_adc · coronal · 5.0mm · 1.80mm/px · 2 of 37 slices shown]
[im 1/37]
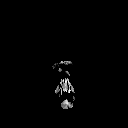
[im 37/37]
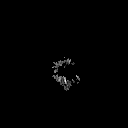

[Series 9: T1 · sagittal · 5.0mm · 0.62mm/px · 1 of 23 slices shown (1 of 2)]
[im 1/23]
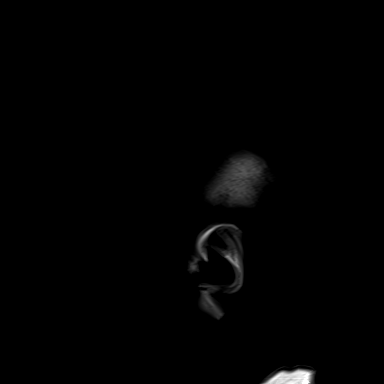

[Series 10: T2 · axial · 5.0mm · 0.86mm/px · 1 of 25 slices shown]
[im 1/25]
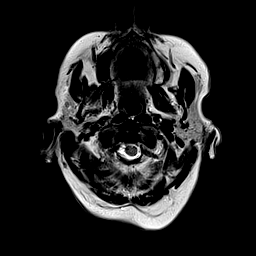

[Series 11: mag_images · axial · 3.0mm · 0.90mm/px · z∈[-83,+68]mm · 3 of 52 slices shown]
[im 1/52]
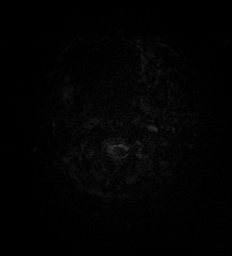
[im 26/52]
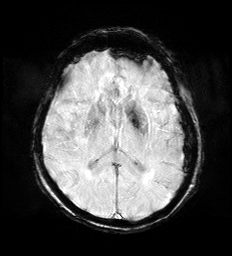
[im 52/52]
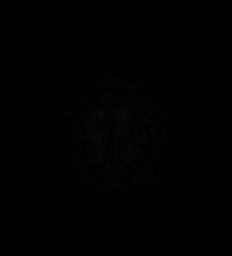

[Series 12: pha_images · axial · 3.0mm · 0.90mm/px · z∈[-80,+68]mm · 3 of 50 slices shown]
[im 1/50]
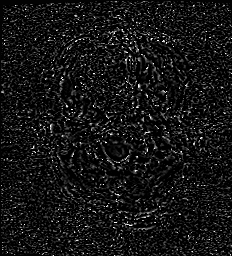
[im 25/50]
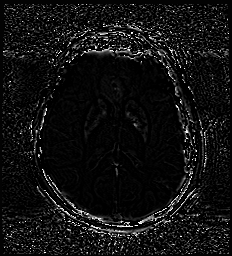
[im 50/50]
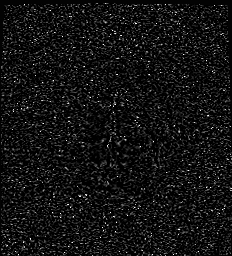

[Series 13: swi_images · axial · 3.0mm · 0.90mm/px · z∈[-83,+68]mm · 3 of 52 slices shown]
[im 1/52]
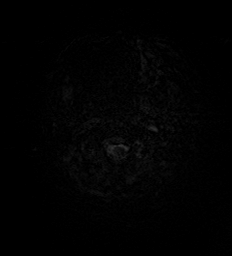
[im 26/52]
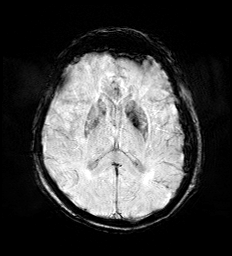
[im 52/52]
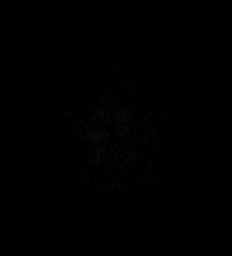

[Series 15: FLAIR · axial · 3.0mm · 0.69mm/px · z∈[-87,+73]mm · 3 of 55 slices shown]
[im 1/55]
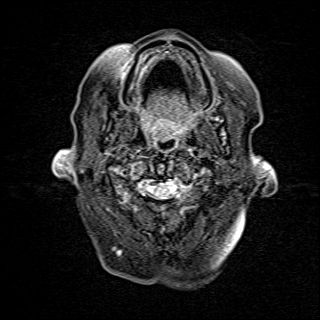
[im 28/55]
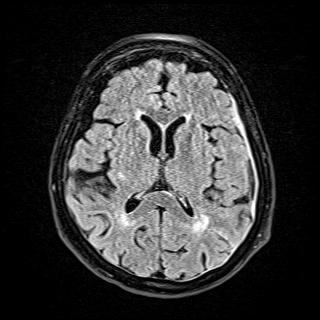
[im 55/55]
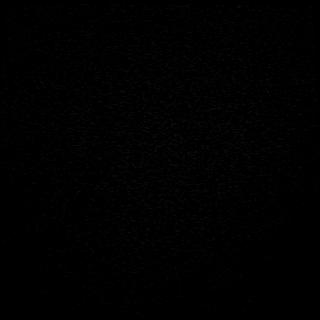

[Series 16: T1 · axial · 1.0mm · 0.98mm/px · z∈[-94,+79]mm · 10 of 174 slices shown (2 of 2)]
[im 1/174]
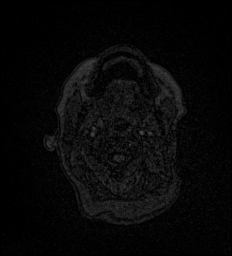
[im 20/174]
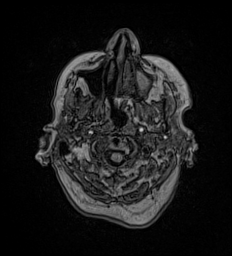
[im 39/174]
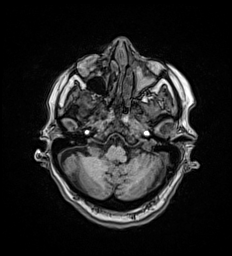
[im 58/174]
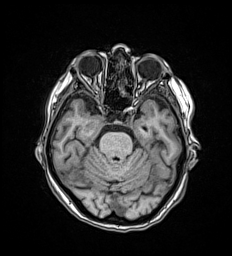
[im 77/174]
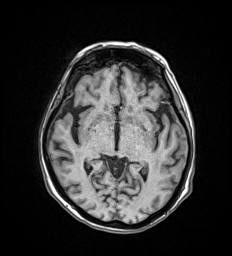
[im 97/174]
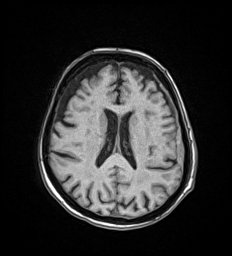
[im 116/174]
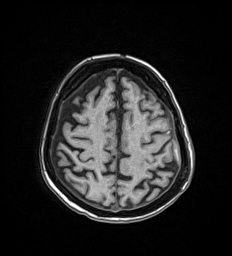
[im 135/174]
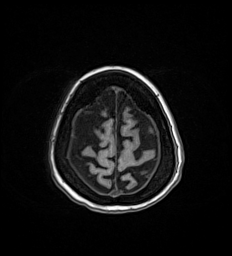
[im 154/174]
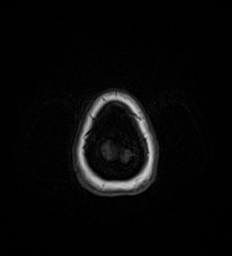
[im 174/174]
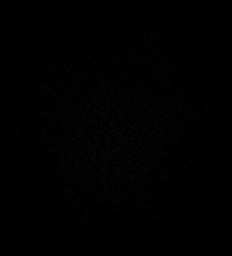

[Series 17: T2 post-contrast · coronal · 5.0mm · 0.57mm/px · 2 of 29 slices shown]
[im 1/29]
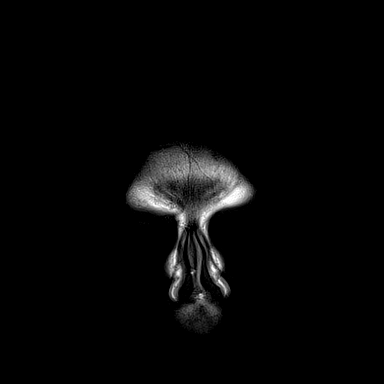
[im 29/29]
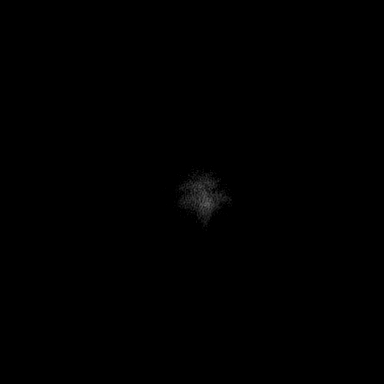

[Series 18: T1 post-contrast · axial · 1.0mm · 0.98mm/px · z∈[-98,+75]mm · 10 of 175 slices shown (1 of 2)]
[im 1/175]
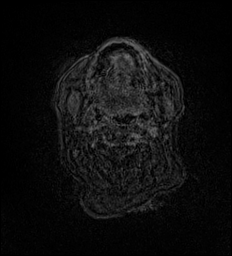
[im 20/175]
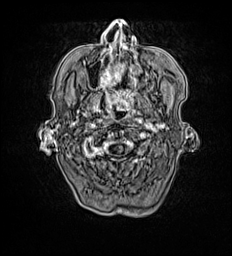
[im 39/175]
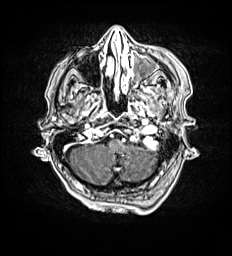
[im 59/175]
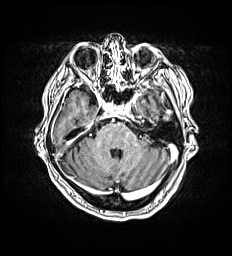
[im 78/175]
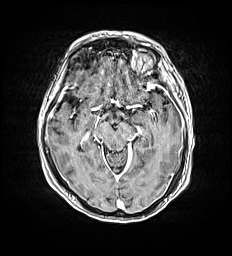
[im 97/175]
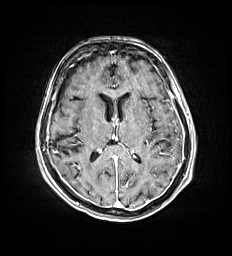
[im 117/175]
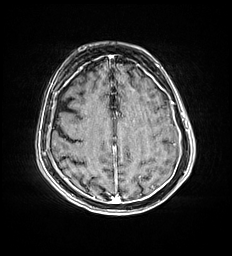
[im 136/175]
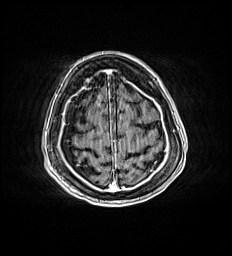
[im 155/175]
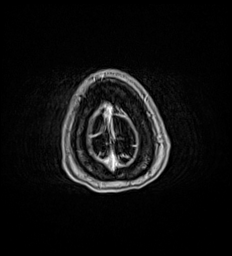
[im 175/175]
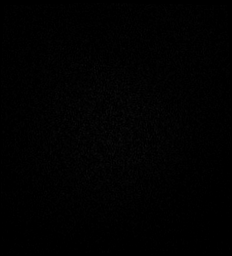

[Series 19: T1 post-contrast · coronal · 5.0mm · 0.57mm/px · 2 of 29 slices shown (2 of 2)]
[im 1/29]
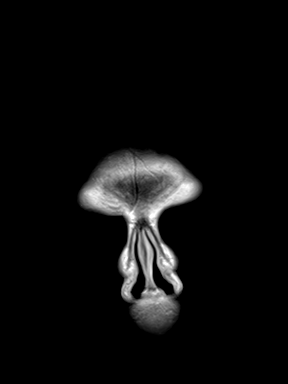
[im 29/29]
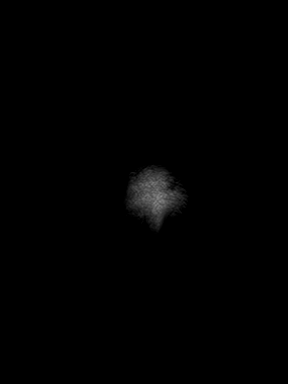

[48 of 48 positions shown; findings below may reference images not displayed]

FINDINGS: MR HEAD FINDINGS

Brain: When comparing across modalities, similar extra-axial
hemorrhage overlying the left frontal convexity, measuring up to
approximately 6 mm in thickness. Similar adjacent small volume left
frontal convexity subarachnoid hemorrhage. Trace extra-axial
hemorrhage overlying the right posterior cerebral convexity, 5 mm in
thickness and not visible on prior CT. No significant mass effect.
No midline shift. No hydrocephalus. No mass lesion. No abnormal
intraparenchymal enhancement. Mildly conspicuous sulcal vessels
along the left frontal convexity, likely reactive from the
hemorrhage. Mild diffuse dural thickening enhancement/thickening,
which is also favored reactive due to the aforementioned hemorrhage.
Partially empty sella.

Vascular: See below.

Skull and upper cervical spine: Normal marrow signal.

Sinuses/Orbits: Opacification scattered ethmoid air cells. Complete
opacification of the left maxillary sinus.

MRA HEAD FINDINGS
Motion limited evaluation.

Anterior circulation: Bilateral intracranial ICAs, MCAs, and ACAs
are patent without proximal high-grade stenosis. No aneurysm
identified.

Posterior circulation: Bilateral intradural vertebral arteries,
basilar artery, and bilateral posterior cerebral arteries are patent
without proximal high-grade stenosis. No aneurysm identified.

MRA NECK FINDINGS

Aortic arch: Great vessel origins are patent. Limited assessment by
MRA without evidence of high-grade stenosis.

Right carotid system: Patent. Apparent narrowing of the common
carotid artery may be artifactual given motion artifact this region.
Otherwise, no evidence significant stenosis. Mild ICA narrowing.
Tortuous ICA.

Left carotid system: Limited evaluation proximally due to artifact.
No evidence of significant stenosis.

Vertebral arteries: Limited evaluation proximally due to artifact.
The vertebral arteries are patent without evidence of significant
stenosis. The vertebral arteries are tortuous bilaterally.
IMPRESSION: MRI:

1. When comparing across modalities, similar extra-axial hemorrhage
overlying the left frontal convexity (likely subdural and
subarachnoid), measuring up to approximately 6 mm in thickness.
Trace extra-axial hemorrhage overlying the right posterior cerebral
convexity, 5 mm in thickness and not visible on prior CT. No
significant mass effect.
2. No acute infarct.
3. Paranasal sinus disease, described above.

MRA head:

No large vessel occlusion or proximal high-grade stenosis.

MRA neck:

Patent major arteries in the neck. Evaluation proximally is limited
by artifact without definite visible hemodynamically significant
stenosis. A CTA could provide more sensitive evaluation if
clinically indicated.

## 2021-06-30 IMAGING — MR MR MRA HEAD W/O CM
1 series · 17 of 48 positions shown · IV contrast (gadavist)
Comparison: CT head [DATE].

CLINICAL DATA: Neuro deficit, acute, stroke suspected; Carotid
artery stenosis screening, risk factors; Transient ischemic attack
(TIA) Seizure, new-onset, history of trauma Stroke, follow up Neuro
deficit, acute, stroke suspected

EXAM:
MRI HEAD WITHOUT AND WITH CONTRAST
MRA HEAD WITHOUT CONTRAST
MRA OF THE NECK WITHOUT AND WITH CONTRAST
TECHNIQUE: Multiplanar, multi-echo pulse sequences of the brain and surrounding
structures were acquired without and with intravenous contrast.
Angiographic images of the Circle of Willis were acquired using MRA
technique intravenous contrast. Angiographic images of the neck were
acquired using MRA technique without and with intravenous contrast.
Carotid stenosis measurements (when applicable) are obtained
utilizing NASCET criteria, using the distal internal carotid
diameter as the denominator.
CONTRAST:  7mL GADAVIST GADOBUTROL 1 MMOL/ML IV SOLN

[Series 1: TOF · axial · 0.5mm · 0.41mm/px · z∈[-83,+13]mm · 17 of 205 slices shown]
[im 1/205]
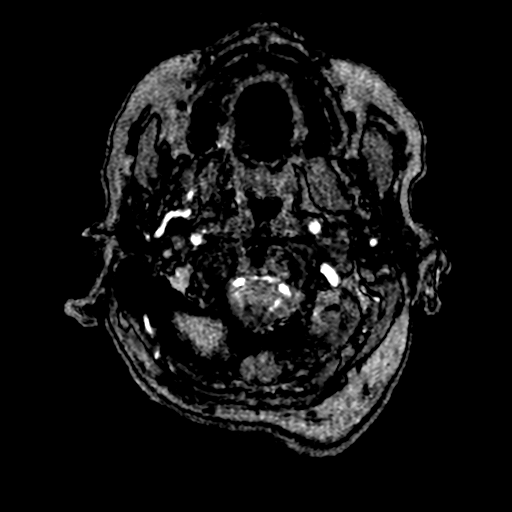
[im 5/205]
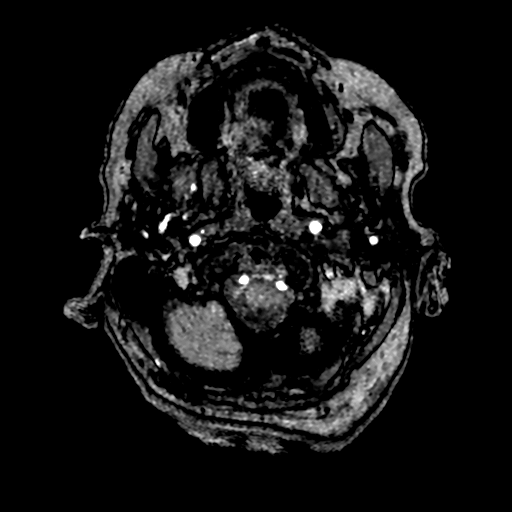
[im 9/205]
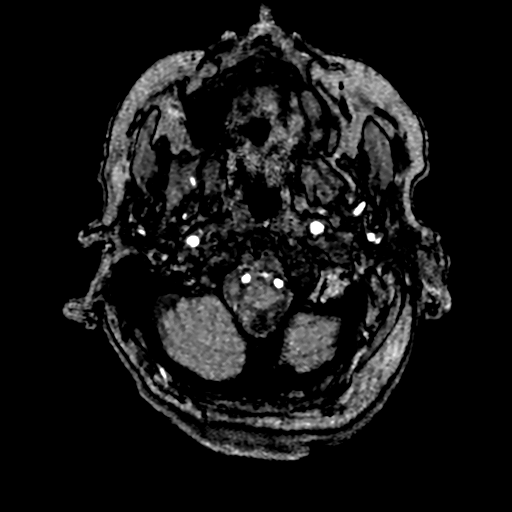
[im 14/205]
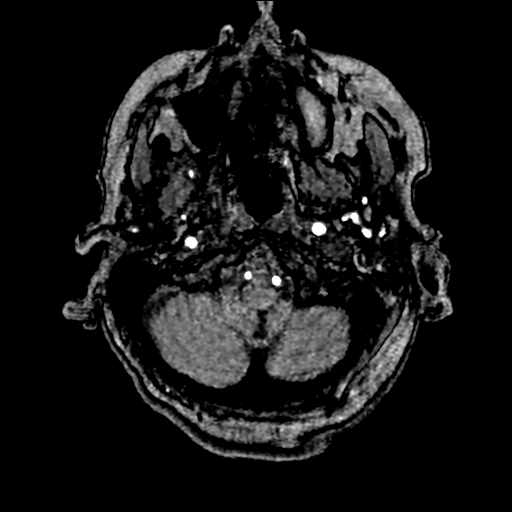
[im 18/205]
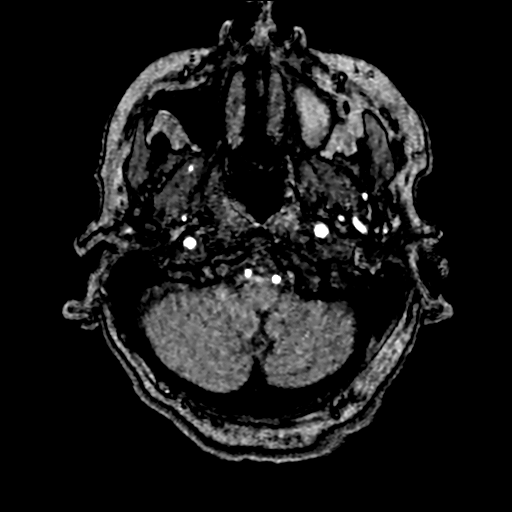
[im 22/205]
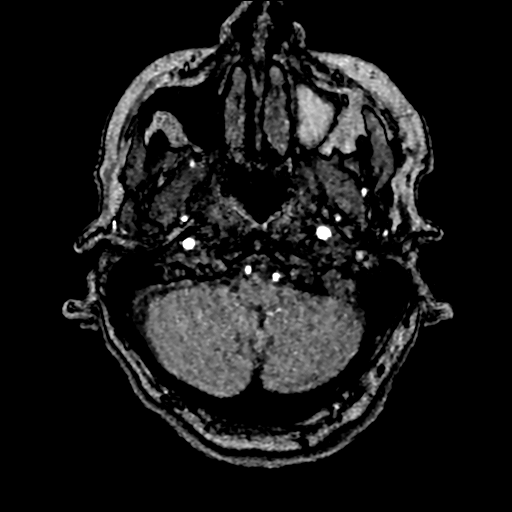
[im 27/205]
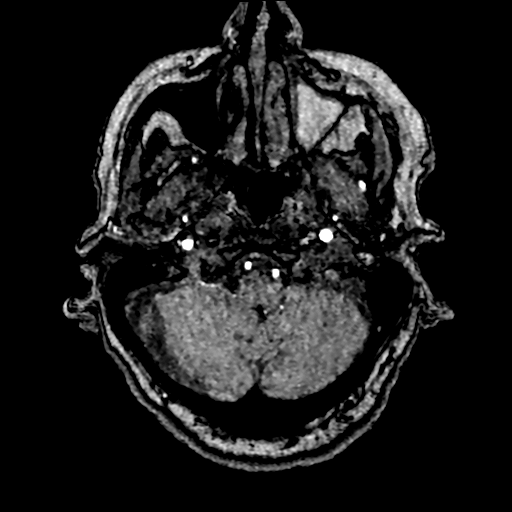
[im 35/205]
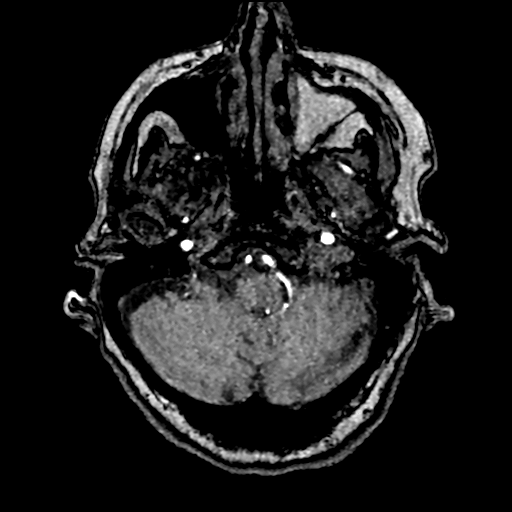
[im 40/205]
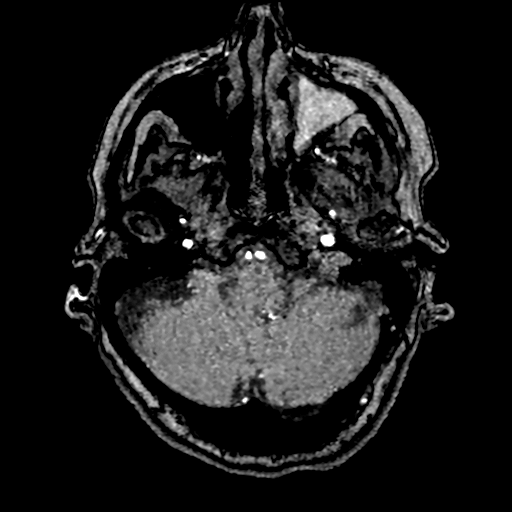
[im 66/205]
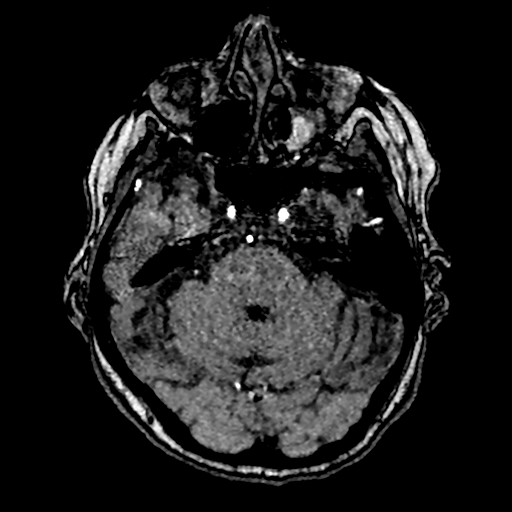
[im 92/205]
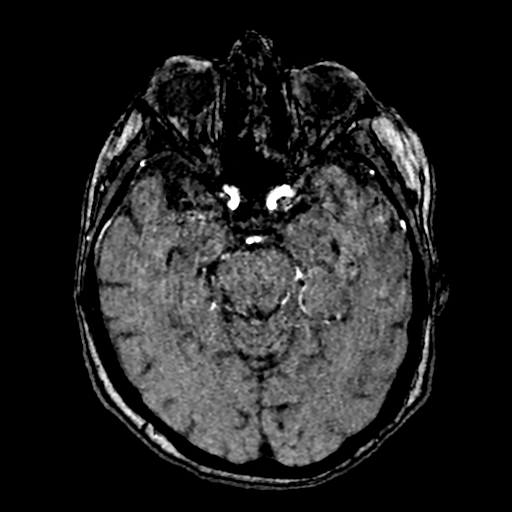
[im 105/205]
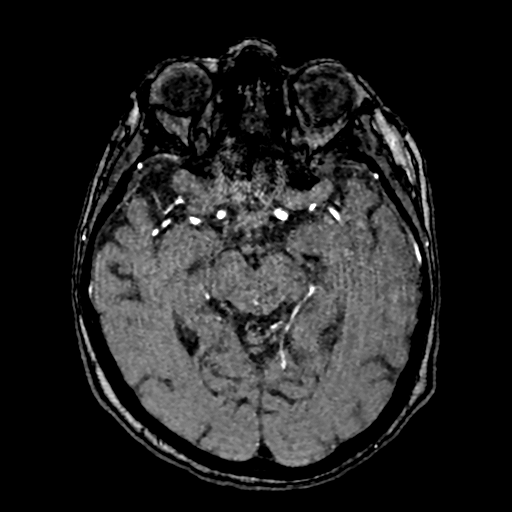
[im 118/205]
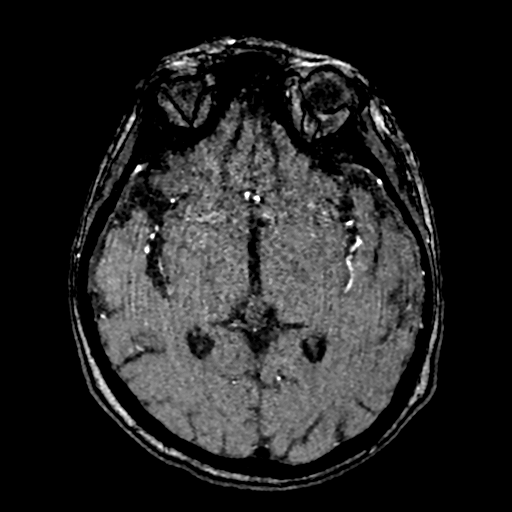
[im 144/205]
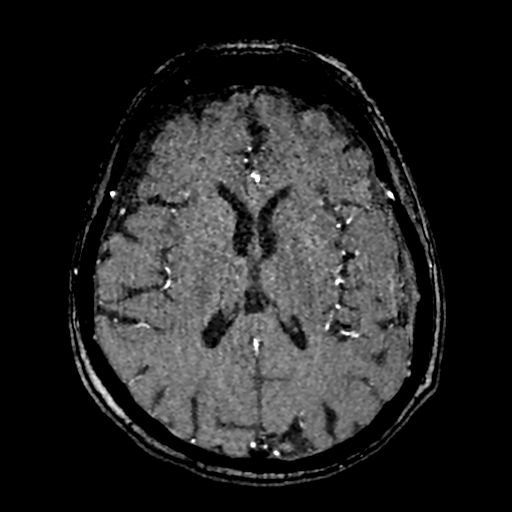
[im 170/205]
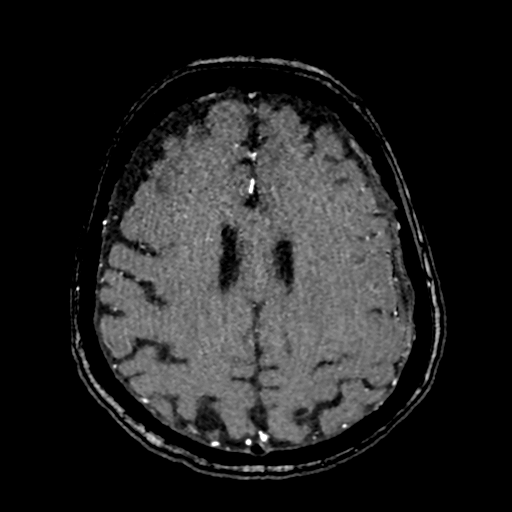
[im 174/205]
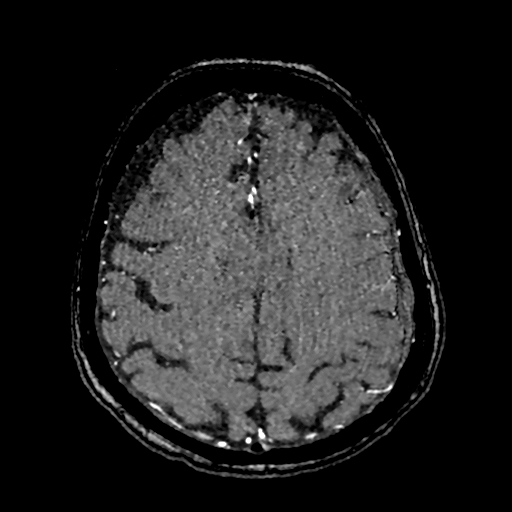
[im 196/205]
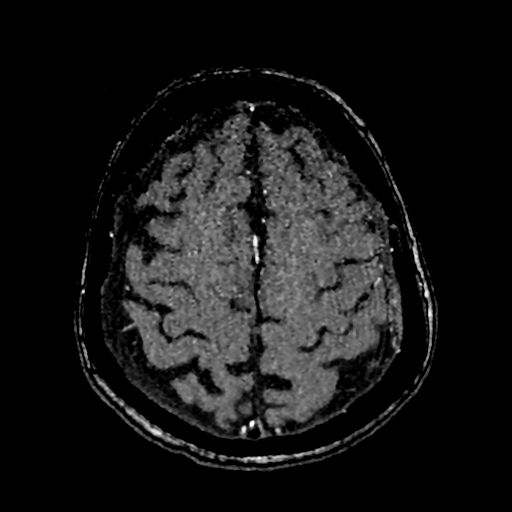

[17 of 48 positions shown; findings below may reference images not displayed]

FINDINGS: MR HEAD FINDINGS

Brain: When comparing across modalities, similar extra-axial
hemorrhage overlying the left frontal convexity, measuring up to
approximately 6 mm in thickness. Similar adjacent small volume left
frontal convexity subarachnoid hemorrhage. Trace extra-axial
hemorrhage overlying the right posterior cerebral convexity, 5 mm in
thickness and not visible on prior CT. No significant mass effect.
No midline shift. No hydrocephalus. No mass lesion. No abnormal
intraparenchymal enhancement. Mildly conspicuous sulcal vessels
along the left frontal convexity, likely reactive from the
hemorrhage. Mild diffuse dural thickening enhancement/thickening,
which is also favored reactive due to the aforementioned hemorrhage.
Partially empty sella.

Vascular: See below.

Skull and upper cervical spine: Normal marrow signal.

Sinuses/Orbits: Opacification scattered ethmoid air cells. Complete
opacification of the left maxillary sinus.

MRA HEAD FINDINGS
Motion limited evaluation.

Anterior circulation: Bilateral intracranial ICAs, MCAs, and ACAs
are patent without proximal high-grade stenosis. No aneurysm
identified.

Posterior circulation: Bilateral intradural vertebral arteries,
basilar artery, and bilateral posterior cerebral arteries are patent
without proximal high-grade stenosis. No aneurysm identified.

MRA NECK FINDINGS

Aortic arch: Great vessel origins are patent. Limited assessment by
MRA without evidence of high-grade stenosis.

Right carotid system: Patent. Apparent narrowing of the common
carotid artery may be artifactual given motion artifact this region.
Otherwise, no evidence significant stenosis. Mild ICA narrowing.
Tortuous ICA.

Left carotid system: Limited evaluation proximally due to artifact.
No evidence of significant stenosis.

Vertebral arteries: Limited evaluation proximally due to artifact.
The vertebral arteries are patent without evidence of significant
stenosis. The vertebral arteries are tortuous bilaterally.
IMPRESSION: MRI:

1. When comparing across modalities, similar extra-axial hemorrhage
overlying the left frontal convexity (likely subdural and
subarachnoid), measuring up to approximately 6 mm in thickness.
Trace extra-axial hemorrhage overlying the right posterior cerebral
convexity, 5 mm in thickness and not visible on prior CT. No
significant mass effect.
2. No acute infarct.
3. Paranasal sinus disease, described above.

MRA head:

No large vessel occlusion or proximal high-grade stenosis.

MRA neck:

Patent major arteries in the neck. Evaluation proximally is limited
by artifact without definite visible hemodynamically significant
stenosis. A CTA could provide more sensitive evaluation if
clinically indicated.

## 2021-06-30 IMAGING — CT CT HEAD W/O CM
4 of 5 series · 16 of 47 positions shown, 18 images · non-contrast
Comparison: CT from [DATE].

CLINICAL DATA: Follow-up examination for intracranial hemorrhage.

EXAM:
CT HEAD WITHOUT CONTRAST
TECHNIQUE: Contiguous axial images were obtained from the base of the skull
through the vertex without intravenous contrast.

[Series 2: head wo · axial · 0.42mm/px · z∈[-122,-22]mm · 5 of 31 slices shown, 7 images]
[im 6/31  brain]
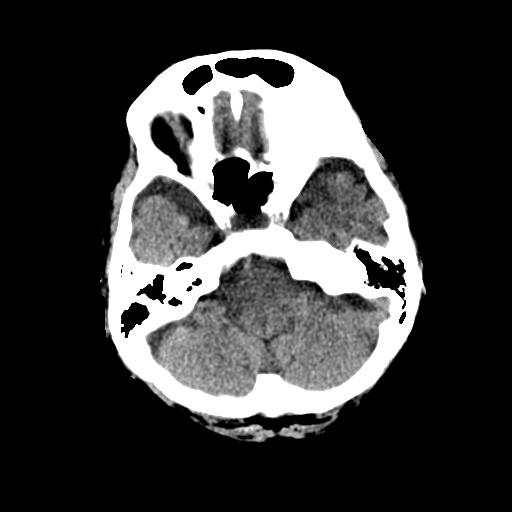
[im 6/31  bone]
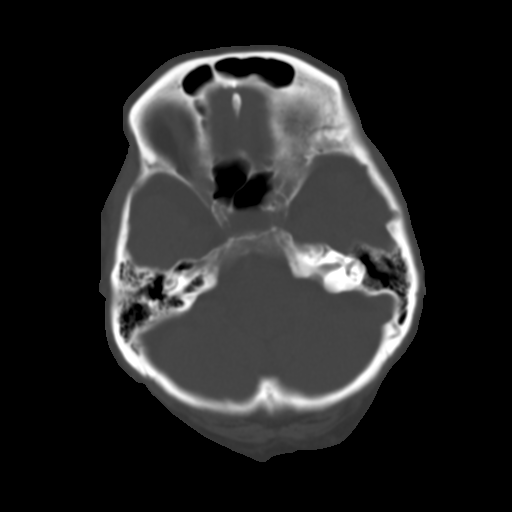
[im 11/31  brain]
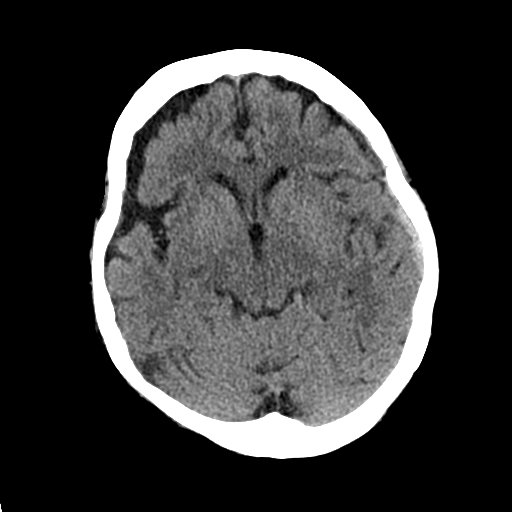
[im 16/31  brain]
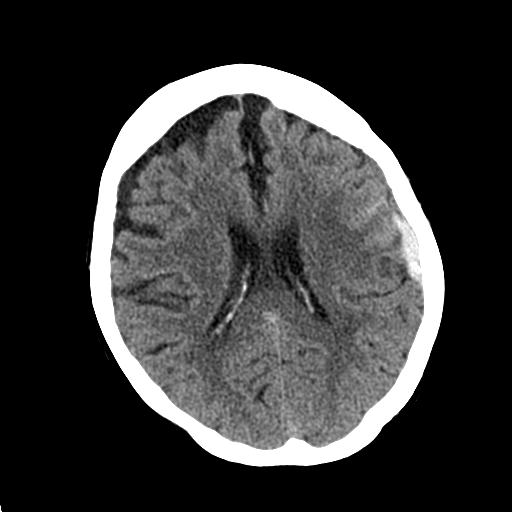
[im 21/31  brain]
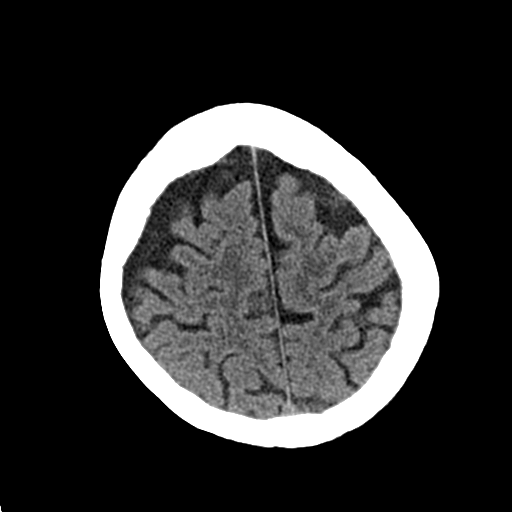
[im 26/31  brain]
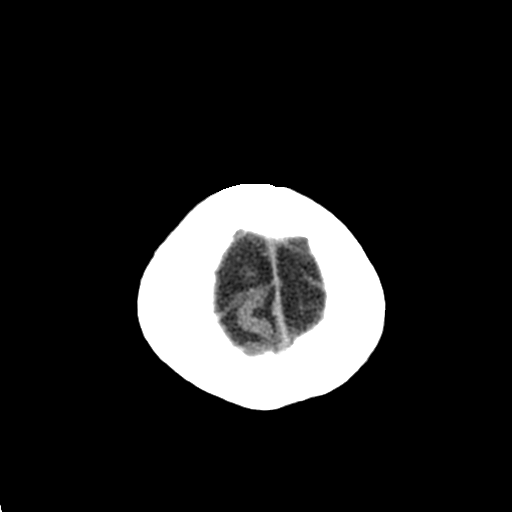
[im 26/31  bone]
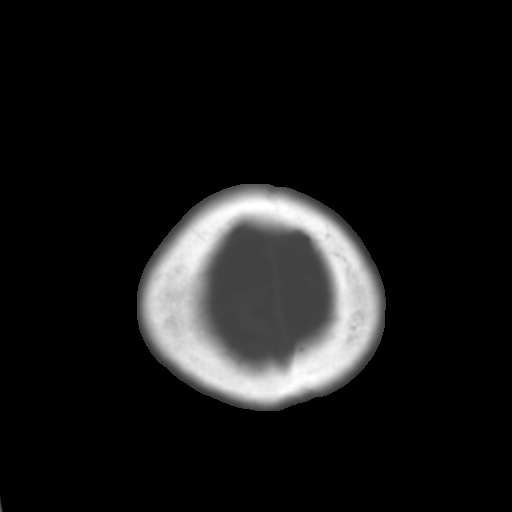

[Series 4: ax head wo · axial · 0.34mm/px · z∈[-172,-68]mm · 5 of 33 slices shown]
[im 6/33  brain]
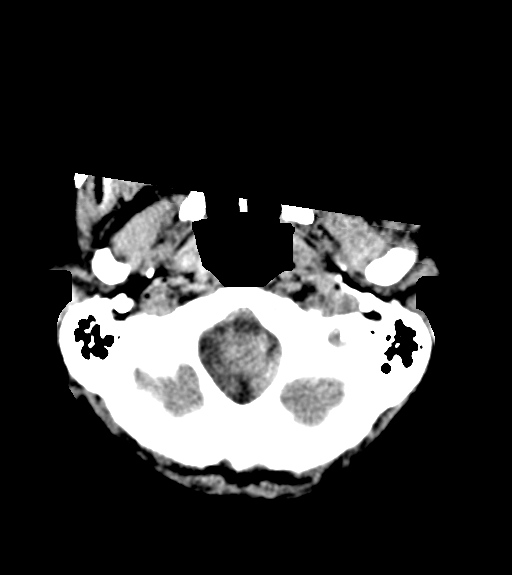
[im 11/33  brain]
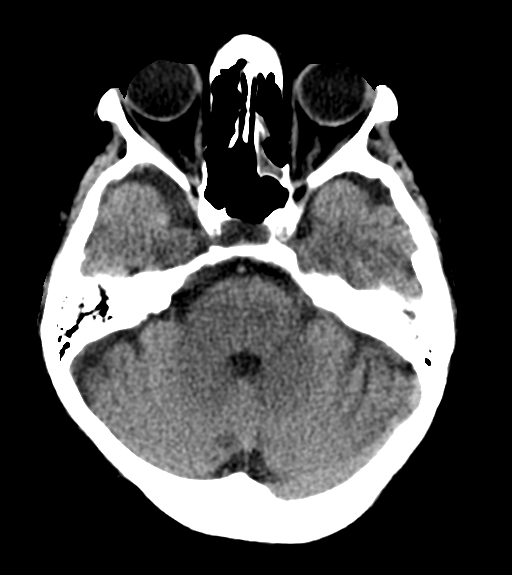
[im 17/33  brain]
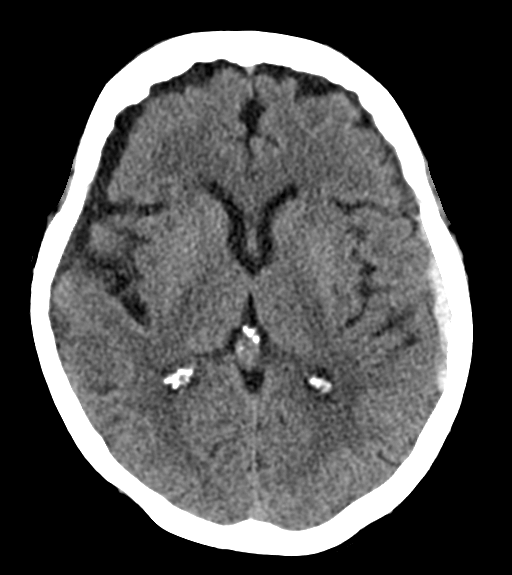
[im 22/33  brain]
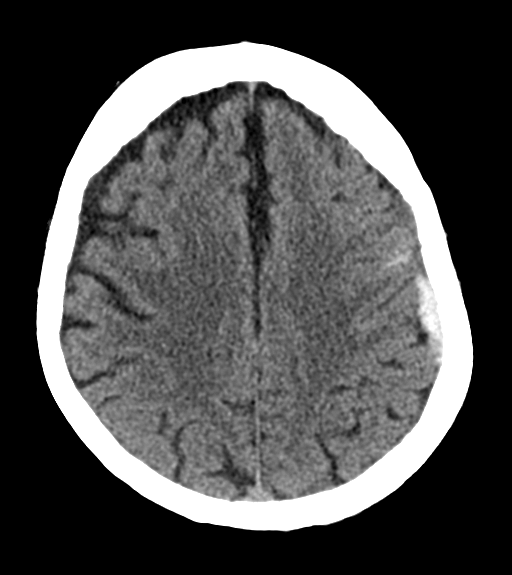
[im 27/33  brain]
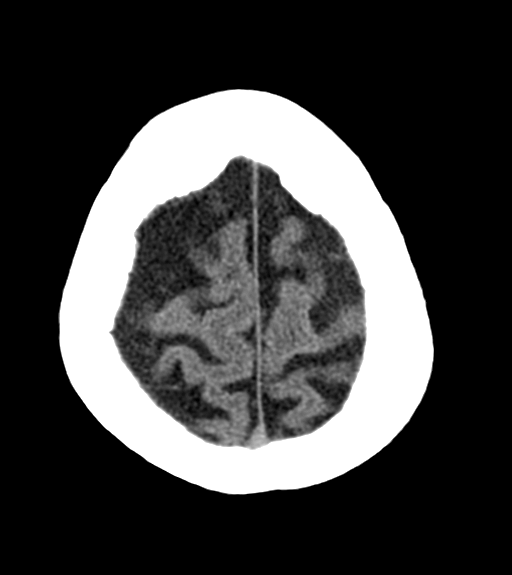

[Series 5: coronal soft tissue · coronal · 0.31mm/px · 3 of 64 slices shown]
[im 22/64  brain]
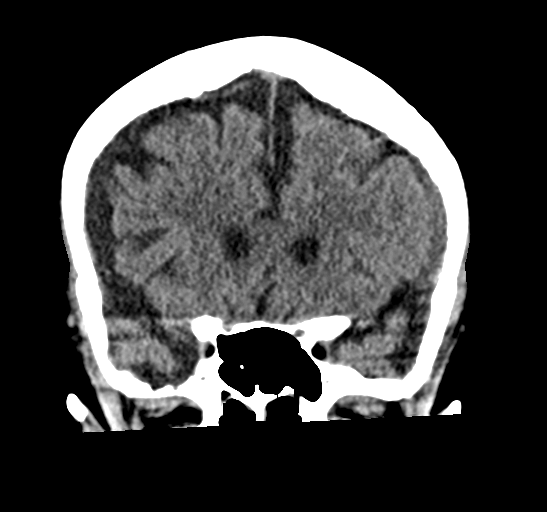
[im 29/64  brain]
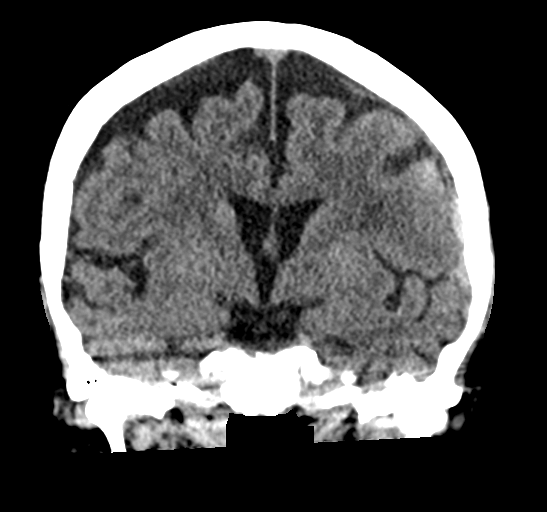
[im 36/64  brain]
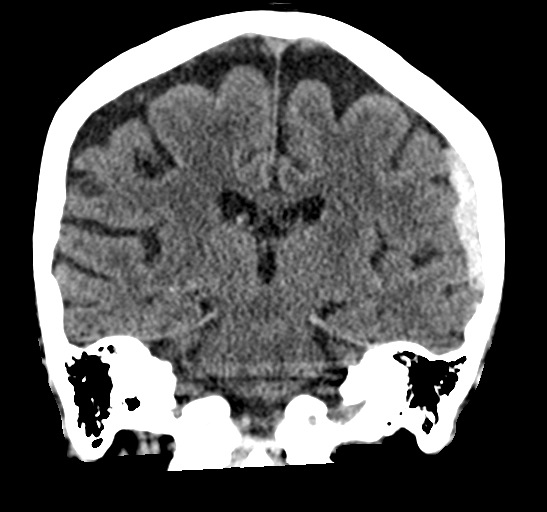

[Series 6: sagittal soft tissue · sagittal · 0.34mm/px · 3 of 53 slices shown]
[im 18/53  brain]
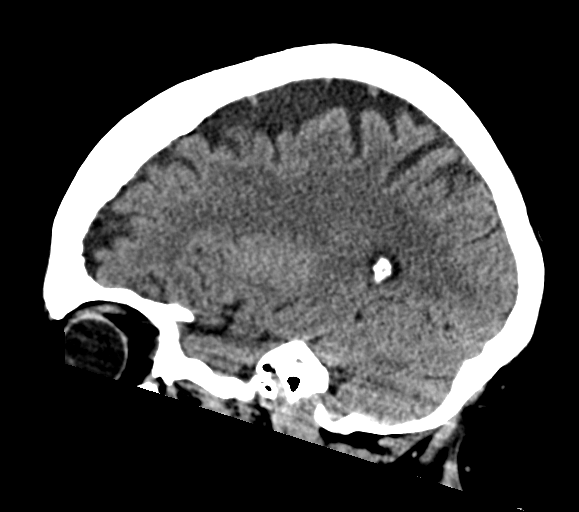
[im 27/53  brain]
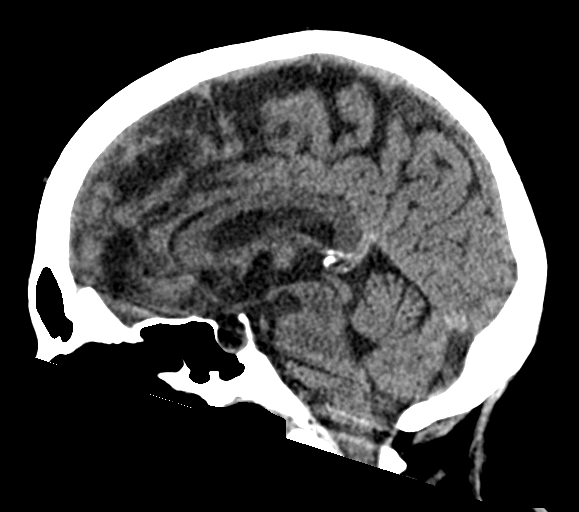
[im 35/53  brain]
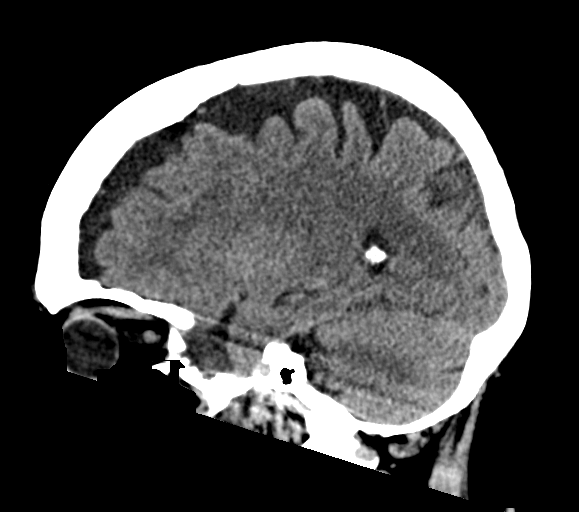

[16 of 47 positions shown; findings below may reference images not displayed]

FINDINGS: Brain: Previously identified extra-axial hemorrhage overlying the
left cerebral convexity again seen, not significantly changed in
size or morphology as compared to prior. Hemorrhage measures up to 6
mm in maximal thickness, stable. Mild mass effect on the subjacent
left cerebral hemisphere without significant midline shift. Probable
trace subarachnoid hemorrhage within the underlying left frontal
lobe noted, also stable (series 2, image 17).

No other new acute intracranial hemorrhage. No acute large vessel
territory infarct. No mass lesion or hydrocephalus. Atrophy with
chronic microvascular ischemic disease again noted.

Vascular: No hyperdense vessel.

Skull: Scalp soft tissues and calvarium demonstrate no new finding.

Sinuses/Orbits: Globes and orbital soft tissues demonstrate no acute
finding. Chronic left ethmoidal and maxillary sinusitis partially
visualized. Mastoid air cells remain clear.

Other: None.
IMPRESSION: 1. No significant interval change in extra-axial hemorrhage
overlying the left cerebral convexity measuring up to 6 mm in
maximal thickness. Mild mass effect on the subjacent left cerebral
hemisphere without significant midline shift.
2. Associated trace subarachnoid hemorrhage within the underlying
left frontal lobe, also stable.
3. No other new acute intracranial abnormality.

## 2021-06-30 MED ORDER — GADOBUTROL 1 MMOL/ML IV SOLN
7.0000 mL | Freq: Once | INTRAVENOUS | Status: AC | PRN
  Administered 2021-06-30: 11:00:00 7 mL via INTRAVENOUS

## 2021-06-30 MED ORDER — ASCORBIC ACID 500 MG PO TABS
500.0000 mg | ORAL_TABLET | Freq: Every day | ORAL | Status: DC
  Administered 2021-07-01: 09:00:00 500 mg via ORAL
  Filled 2021-06-30: qty 1

## 2021-06-30 MED ORDER — IPRATROPIUM BROMIDE 0.03 % NA SOLN
2.0000 | Freq: Two times a day (BID) | NASAL | Status: DC
  Administered 2021-07-01: 09:00:00 2 via NASAL
  Filled 2021-06-30 (×2): qty 30

## 2021-06-30 MED ORDER — MELATONIN 5 MG PO TABS
2.5000 mg | ORAL_TABLET | Freq: Every evening | ORAL | Status: DC | PRN
  Administered 2021-06-30: 21:00:00 2.5 mg via ORAL
  Filled 2021-06-30 (×2): qty 0.5

## 2021-06-30 MED ORDER — GLUCOSAMINE-CHONDROITIN 500-400 MG PO CAPS: 1.0000 | ORAL_CAPSULE | Freq: Every day | ORAL | Status: DC

## 2021-06-30 MED ORDER — VITAMIN D 25 MCG (1000 UNIT) PO TABS
1000.0000 [IU] | ORAL_TABLET | Freq: Every day | ORAL | Status: DC
Start: 2021-07-01 — End: 2021-07-01
  Administered 2021-07-01: 09:00:00 1000 [IU] via ORAL
  Filled 2021-06-30 (×2): qty 1

## 2021-06-30 MED ORDER — ADULT MULTIVITAMIN W/MINERALS CH
1.0000 | ORAL_TABLET | Freq: Every day | ORAL | Status: DC
  Administered 2021-07-01: 09:00:00 1 via ORAL
  Filled 2021-06-30: qty 1

## 2021-06-30 MED ORDER — METOPROLOL TARTRATE 50 MG PO TABS
50.0000 mg | ORAL_TABLET | Freq: Two times a day (BID) | ORAL | Status: DC
  Administered 2021-06-30 – 2021-07-01 (×2): 50 mg via ORAL
  Filled 2021-06-30 (×2): qty 1

## 2021-06-30 MED ORDER — OYSTER SHELL CALCIUM/D3 500-5 MG-MCG PO TABS
1.0000 | ORAL_TABLET | Freq: Two times a day (BID) | ORAL | Status: DC
  Administered 2021-06-30 – 2021-07-01 (×2): 1 via ORAL
  Filled 2021-06-30 (×2): qty 1

## 2021-06-30 NOTE — Consult Note (Signed)
Neurosurgery-New Consultation Evaluation 06/30/2021 Mallory Oconnor [id number]  Identifying Statement: Mallory Oconnor is a 87 y.o. female from [place] [address] with subdural hematoma  Physician Requesting Consultation: [facility] ED  History of Present Illness: [name] is here for episode of weakness and speech difficulty that she says started yesterday. She endorses a fall a couple of days ago. She is on no antiplatelet or anticoagulant. She had a CT that showed a small acute SDH on the left. Given this, neurosurgery is consulted  On my exam, she states her speech is getting better but not completely at baseline.   Past Medical History:  Past Medical History:  Diagnosis Date   Hypertension     Social History: Social History   Socioeconomic History   Marital status: Married    Spouse name: Not on file   Number of children: Not on file   Years of education: Not on file   Highest education level: Not on file  Occupational History   Not on file  Tobacco Use   Smoking status: Former    Types: Cigarettes   Smokeless tobacco: Never  Substance and Sexual Activity   Alcohol use: Not Currently   Drug use: Never   Sexual activity: Not Currently  Other Topics Concern   Not on file  Social History Narrative   Not on file   Social Determinants of Health   Financial Resource Strain: Not on file  Food Insecurity: Not on file  Transportation Needs: Not on file  Physical Activity: Not on file  Stress: Not on file  Social Connections: Not on file  Intimate Partner Violence: Not on file     Family History: Family History  Problem Relation Age of Onset   Stroke Maternal Grandmother    Stroke Paternal Grandmother     Review of Systems:  Review of Systems - General ROS: Negative Psychological ROS: Negative Ophthalmic ROS: Negative ENT ROS: Negative Hematological and Lymphatic ROS: Negative  Endocrine ROS: Negative Respiratory ROS: Negative Cardiovascular  ROS: Negative Gastrointestinal ROS: Negative Genito-Urinary ROS: Negative Musculoskeletal ROS: Negative Neurological ROS: Positive for speech difficulty Dermatological ROS: Negative  Physical Exam: BP (!) 156/75    Pulse 81    Temp 98.1 F (36.7 C) (Oral)    Resp 16    Ht 5' (1.524 m)    Wt 74.8 kg    SpO2 97%    BMI 32.22 kg/m  Body mass index is 32.22 kg/m. Body surface area is 1.78 meters squared. General appearance: Alert, cooperative, in no acute distress Head: Normocephalic, atraumatic Eyes: Normal, EOM intact Oropharynx: Moist without lesions Neck: Supple, Rom appears full Ext: No edema in LE bilaterally, good distal pulses  Neurologic exam:  Mental status: alertness: alert, orientation: person, place, time, affect: normal Speech: fluent and clear, names objects, mild difficulty with repetition  Cranial nerves:  II: Visual fields are full by confrontation, no ptosis III/IV/VI: extra-ocular motions intact bilaterally V/VII:appears to have mild left facial asymmetry VIII: hearing decreased, wearing aids XII: tongue strength symmetric  Motor:strength symmetric 5/5 in upper and lower extremities Sensory: intact to light touch in all extremities Gait: not tested   Laboratory: Results for orders placed or performed during the hospital encounter of 06/29/21  Resp Panel by RT-PCR (Flu A&B, Covid) Nasopharyngeal Swab   Specimen: Nasopharyngeal Swab; Nasopharyngeal(NP) swabs in vial transport medium  Result Value Ref Range   SARS Coronavirus 2 by RT PCR NEGATIVE NEGATIVE   Influenza A by PCR NEGATIVE NEGATIVE  Influenza B by PCR NEGATIVE NEGATIVE  Protime-INR  Result Value Ref Range   Prothrombin Time 14.9 11.4 - 15.2 seconds   INR 1.2 0.8 - 1.2  APTT  Result Value Ref Range   aPTT 40 (H) 24 - 36 seconds  CBC  Result Value Ref Range   WBC 11.4 (H) 4.0 - 10.5 K/uL   RBC 3.95 3.87 - 5.11 MIL/uL   Hemoglobin 11.0 (L) 12.0 - 15.0 g/dL   HCT 70.9 (L) 62.8 - 36.6 %   MCV  86.1 80.0 - 100.0 fL   MCH 27.8 26.0 - 34.0 pg   MCHC 32.4 30.0 - 36.0 g/dL   RDW 29.4 76.5 - 46.5 %   Platelets 376 150 - 400 K/uL   nRBC 0.0 0.0 - 0.2 %  Differential  Result Value Ref Range   Neutrophils Relative % 59 %   Neutro Abs 6.7 1.7 - 7.7 K/uL   Lymphocytes Relative 25 %   Lymphs Abs 2.9 0.7 - 4.0 K/uL   Monocytes Relative 14 %   Monocytes Absolute 1.6 (H) 0.1 - 1.0 K/uL   Eosinophils Relative 1 %   Eosinophils Absolute 0.1 0.0 - 0.5 K/uL   Basophils Relative 1 %   Basophils Absolute 0.1 0.0 - 0.1 K/uL   Immature Granulocytes 0 %   Abs Immature Granulocytes 0.05 0.00 - 0.07 K/uL  Comprehensive metabolic panel  Result Value Ref Range   Sodium 132 (L) 135 - 145 mmol/L   Potassium 5.1 3.5 - 5.1 mmol/L   Chloride 98 98 - 111 mmol/L   CO2 25 22 - 32 mmol/L   Glucose, Bld 130 (H) 70 - 99 mg/dL   BUN 26 (H) 8 - 23 mg/dL   Creatinine, Ser 0.35 0.44 - 1.00 mg/dL   Calcium 9.5 8.9 - 46.5 mg/dL   Total Protein 8.6 (H) 6.5 - 8.1 g/dL   Albumin 3.0 (L) 3.5 - 5.0 g/dL   AST 27 15 - 41 U/L   ALT 6 0 - 44 U/L   Alkaline Phosphatase 69 38 - 126 U/L   Total Bilirubin 0.9 0.3 - 1.2 mg/dL   GFR, Estimated >68 >12 mL/min   Anion gap 9 5 - 15  Basic metabolic panel  Result Value Ref Range   Sodium 135 135 - 145 mmol/L   Potassium 4.3 3.5 - 5.1 mmol/L   Chloride 98 98 - 111 mmol/L   CO2 28 22 - 32 mmol/L   Glucose, Bld 141 (H) 70 - 99 mg/dL   BUN 22 8 - 23 mg/dL   Creatinine, Ser 7.51 0.44 - 1.00 mg/dL   Calcium 9.5 8.9 - 70.0 mg/dL   GFR, Estimated >17 >49 mL/min   Anion gap 9 5 - 15  CBC  Result Value Ref Range   WBC 11.2 (H) 4.0 - 10.5 K/uL   RBC 4.00 3.87 - 5.11 MIL/uL   Hemoglobin 10.9 (L) 12.0 - 15.0 g/dL   HCT 44.9 (L) 67.5 - 91.6 %   MCV 85.3 80.0 - 100.0 fL   MCH 27.3 26.0 - 34.0 pg   MCHC 32.0 30.0 - 36.0 g/dL   RDW 38.4 66.5 - 99.3 %   Platelets 370 150 - 400 K/uL   nRBC 0.0 0.0 - 0.2 %  Phosphorus  Result Value Ref Range   Phosphorus 3.1 2.5 - 4.6 mg/dL   Magnesium  Result Value Ref Range   Magnesium 2.2 1.7 - 2.4 mg/dL  TSH  Result Value Ref  Range   TSH 4.007 0.350 - 4.500 uIU/mL  Vitamin B12  Result Value Ref Range   Vitamin B-12 707 180 - 914 pg/mL   I personally reviewed labs  Imaging: CT Head: 1. No significant interval change in extra-axial hemorrhage overlying the left cerebral convexity measuring up to 6 mm in maximal thickness. Mild mass effect on the subjacent left cerebral hemisphere without significant midline shift. 2. Associated trace subarachnoid hemorrhage within the underlying left frontal lobe, also stable. 3. No other new acute intracranial abnormality.   Impression/Plan:  [name] is here for an acute small SDH and some speech difficulty. Given the small nature of this, no surgery is needed. She does have some symptoms still so have recommended starting keppra to treat possible seizures. However, I am concerned that she still may have some component of ischemia and she needs neurology evaluation for further testing including possible MRI and EEG   1.  Diagnosis: Small acute subdural hematoma  2.  Plan - SDH stable on repeat scan, no surgery indicated - Recommend neurology consultation

## 2021-06-30 NOTE — Progress Notes (Signed)
[facility]  - [place] [facility]   PATIENT NAME: Mallory Oconnor    MR#:  [id number]  DATE OF BIRTH:  26-Sep-1931  SUBJECTIVE:   patient came in after developing left-sided weakness and difficulty finding her words I diarrhea left. She had a fall few days ago did not tell anyone. Came to the emergency room was found to have acute left subdural hematoma.  Patient overall feels better. Daughter at bedside. She has mild dysarthria. Left-sided weakness mild. Able to swallow well. No seizures reported. REVIEW OF SYSTEMS:   Review of Systems  Constitutional:  Negative for chills, fever and weight loss.  HENT:  Negative for ear discharge, ear pain and nosebleeds.   Eyes:  Negative for blurred vision, pain and discharge.  Respiratory:  Negative for sputum production, shortness of breath, wheezing and stridor.   Cardiovascular:  Negative for chest pain, palpitations, orthopnea and PND.  Gastrointestinal:  Negative for abdominal pain, diarrhea, nausea and vomiting.  Genitourinary:  Negative for frequency and urgency.  Musculoskeletal:  Negative for back pain and joint pain.  Neurological:  Positive for speech change and weakness. Negative for sensory change and focal weakness.  Psychiatric/Behavioral:  Negative for depression and hallucinations. The patient is not nervous/anxious.   Tolerating Diet:yes Tolerating PT: pending  DRUG ALLERGIES:  Not on File  VITALS:  Blood pressure (!) 122/105, pulse 66, temperature 98.4 F (36.9 C), resp. rate 16, height 5' (1.524 m), weight 74.8 kg, SpO2 98 %.  PHYSICAL EXAMINATION:   Physical Exam  GENERAL:  87 y.o.-year-old patient lying in the bed with no acute distress.  HEENT: Head atraumatic, normocephalic. Oropharynx and nasopharynx clear.  LUNGS: Normal breath sounds bilaterally, no wheezing, rales, rhonchi. No use of accessory muscles of respiration.  CARDIOVASCULAR: S1, S2 normal. No murmurs, rubs, or gallops.  ABDOMEN:  Soft, nontender, nondistended. Bowel sounds present. No organomegaly or mass.  EXTREMITIES: No cyanosis, clubbing or edema b/l.    NEUROLOGIC:mild dysarthria gen weakness PSYCHIATRIC:  patient is alert and oriented x 3.  SKIN: No obvious rash, lesion, or ulcer.   LABORATORY PANEL:  CBC Recent Labs  Lab 06/30/21 0632  WBC 11.2*  HGB 10.9*  HCT 34.1*  PLT 370    Chemistries  Recent Labs  Lab 06/29/21 1850 06/29/21 2330 06/30/21 0632  NA 132*  --  135  K 5.1  --  4.3  CL 98  --  98  CO2 25  --  28  GLUCOSE 130*  --  141*  BUN 26*  --  22  CREATININE 0.67  --  0.58  CALCIUM 9.5  --  9.5  MG  --  2.2  --   AST 27  --   --   ALT 6  --   --   ALKPHOS 69  --   --   BILITOT 0.9  --   --    Cardiac Enzymes No results for input(s): TROPONINI in the last 168 hours. RADIOLOGY:  CT HEAD WO CONTRAST ( )  Result Date: 06/30/2021 CLINICAL DATA:  Follow-up examination for intracranial hemorrhage. EXAM: CT HEAD WITHOUT CONTRAST TECHNIQUE: Contiguous axial images were obtained from the base of the skull through the vertex without intravenous contrast. COMPARISON:  CT from 06/29/2021. FINDINGS: Brain: Previously identified extra-axial hemorrhage overlying the left cerebral convexity again seen, not significantly changed in size or morphology as compared to prior. Hemorrhage measures up to 6 mm in maximal thickness, stable. Mild mass effect on the subjacent left  cerebral hemisphere without significant midline shift. Probable trace subarachnoid hemorrhage within the underlying left frontal lobe noted, also stable (series 2, image 17). No other new acute intracranial hemorrhage. No acute large vessel territory infarct. No mass lesion or hydrocephalus. Atrophy with chronic microvascular ischemic disease again noted. Vascular: No hyperdense vessel. Skull: Scalp soft tissues and calvarium demonstrate no new finding. Sinuses/Orbits: Globes and orbital soft tissues demonstrate no acute finding.  Chronic left ethmoidal and maxillary sinusitis partially visualized. Mastoid air cells remain clear. Other: None. IMPRESSION: 1. No significant interval change in extra-axial hemorrhage overlying the left cerebral convexity measuring up to 6 mm in maximal thickness. Mild mass effect on the subjacent left cerebral hemisphere without significant midline shift. 2. Associated trace subarachnoid hemorrhage within the underlying left frontal lobe, also stable. 3. No other new acute intracranial abnormality. Electronically Signed   By: Rise Mu M.D.   On: 06/30/2021 00:45   MR ANGIO HEAD WO CONTRAST  Result Date: 06/30/2021 CLINICAL DATA:  Neuro deficit, acute, stroke suspected; Carotid artery stenosis screening, risk factors; Transient ischemic attack (TIA) Seizure, new-onset, history of trauma Stroke, follow up Neuro deficit, acute, stroke suspected EXAM: MRI HEAD WITHOUT AND WITH CONTRAST MRA HEAD WITHOUT CONTRAST MRA OF THE NECK WITHOUT AND WITH CONTRAST TECHNIQUE: Multiplanar, multi-echo pulse sequences of the brain and surrounding structures were acquired without and with intravenous contrast. Angiographic images of the Circle of Willis were acquired using MRA technique intravenous contrast. Angiographic images of the neck were acquired using MRA technique without and with intravenous contrast. Carotid stenosis measurements (when applicable) are obtained utilizing NASCET criteria, using the distal internal carotid diameter as the denominator. CONTRAST:  7mL GADAVIST GADOBUTROL 1 MMOL/ML IV SOLN COMPARISON:  CT head 06/30/2021. FINDINGS: MR HEAD FINDINGS Brain: When comparing across modalities, similar extra-axial hemorrhage overlying the left frontal convexity, measuring up to approximately 6 mm in thickness. Similar adjacent small volume left frontal convexity subarachnoid hemorrhage. Trace extra-axial hemorrhage overlying the right posterior cerebral convexity, 5 mm in thickness and not visible on  prior CT. No significant mass effect. No midline shift. No hydrocephalus. No mass lesion. No abnormal intraparenchymal enhancement. Mildly conspicuous sulcal vessels along the left frontal convexity, likely reactive from the hemorrhage. Mild diffuse dural thickening enhancement/thickening, which is also favored reactive due to the aforementioned hemorrhage. Partially empty sella. Vascular: See below. Skull and upper cervical spine: Normal marrow signal. Sinuses/Orbits: Opacification scattered ethmoid air cells. Complete opacification of the left maxillary sinus. MRA HEAD FINDINGS Motion limited evaluation. Anterior circulation: Bilateral intracranial ICAs, MCAs, and ACAs are patent without proximal high-grade stenosis. No aneurysm identified. Posterior circulation: Bilateral intradural vertebral arteries, basilar artery, and bilateral posterior cerebral arteries are patent without proximal high-grade stenosis. No aneurysm identified. MRA NECK FINDINGS Aortic arch: Great vessel origins are patent. Limited assessment by MRA without evidence of high-grade stenosis. Right carotid system: Patent. Apparent narrowing of the common carotid artery may be artifactual given motion artifact this region. Otherwise, no evidence significant stenosis. Mild ICA narrowing. Tortuous ICA. Left carotid system: Limited evaluation proximally due to artifact. No evidence of significant stenosis. Vertebral arteries: Limited evaluation proximally due to artifact. The vertebral arteries are patent without evidence of significant stenosis. The vertebral arteries are tortuous bilaterally. IMPRESSION: MRI: 1. When comparing across modalities, similar extra-axial hemorrhage overlying the left frontal convexity (likely subdural and subarachnoid), measuring up to approximately 6 mm in thickness. Trace extra-axial hemorrhage overlying the right posterior cerebral convexity, 5 mm in thickness and not visible on prior  CT. No significant mass effect.  2. No acute infarct. 3. Paranasal sinus disease, described above. MRA head: No large vessel occlusion or proximal high-grade stenosis. MRA neck: Patent major arteries in the neck. Evaluation proximally is limited by artifact without definite visible hemodynamically significant stenosis. A CTA could provide more sensitive evaluation if clinically indicated. Electronically Signed   By: Feliberto Harts M.D.   On: 06/30/2021 11:49   MR ANGIO NECK W WO CONTRAST  Result Date: 06/30/2021 CLINICAL DATA:  Neuro deficit, acute, stroke suspected; Carotid artery stenosis screening, risk factors; Transient ischemic attack (TIA) Seizure, new-onset, history of trauma Stroke, follow up Neuro deficit, acute, stroke suspected EXAM: MRI HEAD WITHOUT AND WITH CONTRAST MRA HEAD WITHOUT CONTRAST MRA OF THE NECK WITHOUT AND WITH CONTRAST TECHNIQUE: Multiplanar, multi-echo pulse sequences of the brain and surrounding structures were acquired without and with intravenous contrast. Angiographic images of the Circle of Willis were acquired using MRA technique intravenous contrast. Angiographic images of the neck were acquired using MRA technique without and with intravenous contrast. Carotid stenosis measurements (when applicable) are obtained utilizing NASCET criteria, using the distal internal carotid diameter as the denominator. CONTRAST:  7mL GADAVIST GADOBUTROL 1 MMOL/ML IV SOLN COMPARISON:  CT head 06/30/2021. FINDINGS: MR HEAD FINDINGS Brain: When comparing across modalities, similar extra-axial hemorrhage overlying the left frontal convexity, measuring up to approximately 6 mm in thickness. Similar adjacent small volume left frontal convexity subarachnoid hemorrhage. Trace extra-axial hemorrhage overlying the right posterior cerebral convexity, 5 mm in thickness and not visible on prior CT. No significant mass effect. No midline shift. No hydrocephalus. No mass lesion. No abnormal intraparenchymal enhancement. Mildly  conspicuous sulcal vessels along the left frontal convexity, likely reactive from the hemorrhage. Mild diffuse dural thickening enhancement/thickening, which is also favored reactive due to the aforementioned hemorrhage. Partially empty sella. Vascular: See below. Skull and upper cervical spine: Normal marrow signal. Sinuses/Orbits: Opacification scattered ethmoid air cells. Complete opacification of the left maxillary sinus. MRA HEAD FINDINGS Motion limited evaluation. Anterior circulation: Bilateral intracranial ICAs, MCAs, and ACAs are patent without proximal high-grade stenosis. No aneurysm identified. Posterior circulation: Bilateral intradural vertebral arteries, basilar artery, and bilateral posterior cerebral arteries are patent without proximal high-grade stenosis. No aneurysm identified. MRA NECK FINDINGS Aortic arch: Great vessel origins are patent. Limited assessment by MRA without evidence of high-grade stenosis. Right carotid system: Patent. Apparent narrowing of the common carotid artery may be artifactual given motion artifact this region. Otherwise, no evidence significant stenosis. Mild ICA narrowing. Tortuous ICA. Left carotid system: Limited evaluation proximally due to artifact. No evidence of significant stenosis. Vertebral arteries: Limited evaluation proximally due to artifact. The vertebral arteries are patent without evidence of significant stenosis. The vertebral arteries are tortuous bilaterally. IMPRESSION: MRI: 1. When comparing across modalities, similar extra-axial hemorrhage overlying the left frontal convexity (likely subdural and subarachnoid), measuring up to approximately 6 mm in thickness. Trace extra-axial hemorrhage overlying the right posterior cerebral convexity, 5 mm in thickness and not visible on prior CT. No significant mass effect. 2. No acute infarct. 3. Paranasal sinus disease, described above. MRA head: No large vessel occlusion or proximal high-grade stenosis. MRA  neck: Patent major arteries in the neck. Evaluation proximally is limited by artifact without definite visible hemodynamically significant stenosis. A CTA could provide more sensitive evaluation if clinically indicated. Electronically Signed   By: Feliberto Harts M.D.   On: 06/30/2021 11:49   MR BRAIN W WO CONTRAST  Result Date: 06/30/2021 CLINICAL DATA:  Neuro deficit,  acute, stroke suspected; Carotid artery stenosis screening, risk factors; Transient ischemic attack (TIA) Seizure, new-onset, history of trauma Stroke, follow up Neuro deficit, acute, stroke suspected EXAM: MRI HEAD WITHOUT AND WITH CONTRAST MRA HEAD WITHOUT CONTRAST MRA OF THE NECK WITHOUT AND WITH CONTRAST TECHNIQUE: Multiplanar, multi-echo pulse sequences of the brain and surrounding structures were acquired without and with intravenous contrast. Angiographic images of the Circle of Willis were acquired using MRA technique intravenous contrast. Angiographic images of the neck were acquired using MRA technique without and with intravenous contrast. Carotid stenosis measurements (when applicable) are obtained utilizing NASCET criteria, using the distal internal carotid diameter as the denominator. CONTRAST:  46mL GADAVIST GADOBUTROL 1 MMOL/ML IV SOLN COMPARISON:  CT head 06/30/2021. FINDINGS: MR HEAD FINDINGS Brain: When comparing across modalities, similar extra-axial hemorrhage overlying the left frontal convexity, measuring up to approximately 6 mm in thickness. Similar adjacent small volume left frontal convexity subarachnoid hemorrhage. Trace extra-axial hemorrhage overlying the right posterior cerebral convexity, 5 mm in thickness and not visible on prior CT. No significant mass effect. No midline shift. No hydrocephalus. No mass lesion. No abnormal intraparenchymal enhancement. Mildly conspicuous sulcal vessels along the left frontal convexity, likely reactive from the hemorrhage. Mild diffuse dural thickening enhancement/thickening,  which is also favored reactive due to the aforementioned hemorrhage. Partially empty sella. Vascular: See below. Skull and upper cervical spine: Normal marrow signal. Sinuses/Orbits: Opacification scattered ethmoid air cells. Complete opacification of the left maxillary sinus. MRA HEAD FINDINGS Motion limited evaluation. Anterior circulation: Bilateral intracranial ICAs, MCAs, and ACAs are patent without proximal high-grade stenosis. No aneurysm identified. Posterior circulation: Bilateral intradural vertebral arteries, basilar artery, and bilateral posterior cerebral arteries are patent without proximal high-grade stenosis. No aneurysm identified. MRA NECK FINDINGS Aortic arch: Great vessel origins are patent. Limited assessment by MRA without evidence of high-grade stenosis. Right carotid system: Patent. Apparent narrowing of the common carotid artery may be artifactual given motion artifact this region. Otherwise, no evidence significant stenosis. Mild ICA narrowing. Tortuous ICA. Left carotid system: Limited evaluation proximally due to artifact. No evidence of significant stenosis. Vertebral arteries: Limited evaluation proximally due to artifact. The vertebral arteries are patent without evidence of significant stenosis. The vertebral arteries are tortuous bilaterally. IMPRESSION: MRI: 1. When comparing across modalities, similar extra-axial hemorrhage overlying the left frontal convexity (likely subdural and subarachnoid), measuring up to approximately 6 mm in thickness. Trace extra-axial hemorrhage overlying the right posterior cerebral convexity, 5 mm in thickness and not visible on prior CT. No significant mass effect. 2. No acute infarct. 3. Paranasal sinus disease, described above. MRA head: No large vessel occlusion or proximal high-grade stenosis. MRA neck: Patent major arteries in the neck. Evaluation proximally is limited by artifact without definite visible hemodynamically significant stenosis. A  CTA could provide more sensitive evaluation if clinically indicated. Electronically Signed   By: Feliberto Harts M.D.   On: 06/30/2021 11:49   CT HEAD CODE STROKE WO CONTRAST  Result Date: 06/29/2021 CLINICAL DATA:  Code stroke. Neuro deficit, acute, stroke suspected. Additional history provided: Slurred speech. EXAM: CT HEAD WITHOUT CONTRAST TECHNIQUE: Contiguous axial images were obtained from the base of the skull through the vertex without intravenous contrast. COMPARISON:  No pertinent prior exams available for comparison. FINDINGS: Brain: Mild generalized cerebral atrophy. Acute extra-axial hemorrhage (favored subdural) overlying the left cerebral hemisphere, greatest overlying the left frontal and parietal lobes, measuring up to 6 mm in thickness. Minimal mass effect upon the underlying left cerebral hemisphere. No midline shift. Adjacent  small volume acute subarachnoid hemorrhage overlying the left frontal lobe. No demarcated cortical infarct. No extra-axial fluid collection. No evidence of an intracranial mass. No midline shift. Vascular: No hyperdense vessel.  Atherosclerotic calcifications. Skull: Normal. Negative for fracture or focal lesion. Sinuses/Orbits: Visualized orbits show no acute finding. Complete opacification of the left maxillary sinus at the imaged levels. Small right sphenoid sinus mucous retention cyst. Partial opacification of the left ethmoid air cells. ASPECTS (Alberta Stroke Program Early CT Score) - Ganglionic level infarction (caudate, lentiform nuclei, internal capsule, insula, M1-M3 cortex): 7 - Supraganglionic infarction (M4-M6 cortex): 3 Total score (0-10 with 10 being normal): 10 These results were called by telephone at the time of interpretation on 06/29/2021 at 6:36 pm to provider Shaune Pollack , who verbally acknowledged these results. IMPRESSION: Acute extra-axial hemorrhage (likely subdural) overlying the left cerebral hemisphere (greatest overlying the left  frontal and parietal lobes), measuring up to 6 mm in thickness. Minimal mass effect upon the underlying left cerebral hemisphere without midline shift. Adjacent small-volume acute subarachnoid hemorrhage overlying the left frontal lobe. No acute demarcated cortical infarct is identified. Mild generalized cerebral atrophy. Paranasal sinus disease at the imaged levels, as described. Electronically Signed   By: Jackey Loge D.O.   On: 06/29/2021 18:38   ASSESSMENT AND PLAN:  Emmalina Oconnor is a 87 y.o. female with medical history significant for hypertension, from [facility], assisted living facility who presents to [facility] for chief concerns of expressive aphasia and left upper extremity weakness during dinner. [name] states that a few days ago, possibly Saturday, she fell and hit her head however she did not tell anyone when she hit her head  Left small subdural hematoma post fall -- patient came in with dysarthria and left sided mild weakness. -- She had a fall past Saturday did not tell anyone. -- Repeat CT head remains stable -- MRI brain reflects CT findings of subdural bleed. No acute infarct. -- Seen by Dr. Adriana Simas neurosurgery-- no surgery Indicated  possible seizure -- patient on Keppra -- seen by neurology Dr. Selina Cooley. EEG pending. -- Seizure precaution  Hypertension -- resumed metoprolol -- consider restarting hydrochlorothiazide of blood pressure remains stable after taking metoprolol.  PT OT to see patient.   Procedures: Family communication : daughter in the room Consults : neurology, neurosurgery CODE STATUS: DNR DVT Prophylaxis : SCD Level of care: Progressive Status is: Observation  The patient remains OBS appropriate and will d/c before 2 midnights.   Anticipate discharge in 1 to 2 days if remains stable. PT OT pending.    TOTAL TIME TAKING CARE OF THIS PATIENT: 30 minutes.  >50% time spent on counselling and coordination of care  Note: This dictation was  prepared with Dragon dictation along with smaller phrase technology. Any transcriptional errors that result from this process are unintentional.  Enedina Finner M.D    [facility]   CC: Primary care physician; System, Provider Not In Patient ID: Kellyanne Oconnor, female   DOB: 05-May-1932, 87 y.o.   MRN: [id number]

## 2021-06-30 NOTE — Progress Notes (Signed)
Eeg done 

## 2021-06-30 NOTE — Care Management Obs Status (Signed)
MEDICARE OBSERVATION STATUS NOTIFICATION   Patient Details  Name: Mallory Oconnor MRN: [id number] Date of Birth: [birth year]   Medicare Observation Status Notification Given:  Yes    Allayne Butcher, RN 06/30/2021, 1:02 PM

## 2021-06-30 NOTE — ED Notes (Signed)
Patient transported to MRI 

## 2021-06-30 NOTE — TOC Initial Note (Signed)
Transition of Care [facility]) - Initial/Assessment Note    Patient Details  Name: Mallory Oconnor MRN: [id number] Date of Birth: [birth year]  [facility] CM/SW Contact:    Allayne Butcher, RN Phone Number: 06/30/2021, 1:05 PM  Clinical Narrative:                 Patient placed under observation for stroke.  MOON reviewed with patient and patient's daughter at the bedside.  Patient is from [facility].  She gets around with her rollator, she does not drive.  She is current with her PCP, Doctors making house calls.  Patient will go home with her daughter at discharge.  She agrees with home health or therapy following her at discharge.  TOC will cont to follow.    Expected Discharge Plan: Home w Home Health Services Barriers to Discharge: Continued Medical Work up   Patient Goals and CMS Choice Patient states their goals for this hospitalization and ongoing recovery are:: To return to [facility] independent living at discharge with PT CMS Medicare.gov Compare Post Acute Care list provided to:: Patient Choice offered to / list presented to : Patient, Adult Children  Expected Discharge Plan and Services Expected Discharge Plan: Home w Home Health Services   Discharge Planning Services: CM Consult Post Acute Care Choice: Home Health Living arrangements for the past 2 months: Independent Living Facility                 DME Arranged: N/A DME Agency: NA       HH Arranged: PT, OT          Prior Living Arrangements/Services Living arrangements for the past 2 months: Independent Living Facility Lives with:: Self Patient language and need for interpreter reviewed:: Yes Do you feel safe going back to the place where you live?: Yes      Need for Family Participation in Patient Care: Yes (Comment) Care giver support system in place?: Yes (comment) (daughter) Current home services: DME (rollator) Criminal Activity/Legal Involvement Pertinent to Current  Situation/Hospitalization: No - Comment as needed  Activities of Daily Living      Permission Sought/Granted Permission sought to share information with : Case Manager, Family Supports, Magazine features editor Permission granted to share information with : Yes, Verbal Permission Granted  Share Information with NAME: Hollice Gong  Permission granted to share info w AGENCY: [facility]  Permission granted to share info w Relationship: daughter  Permission granted to share info w Contact Information: [phone]  Emotional Assessment Appearance:: Appears stated age Attitude/Demeanor/Rapport: Engaged Affect (typically observed): Accepting Orientation: : Oriented to Self, Oriented to Place, Oriented to  Time, Oriented to Situation Alcohol / Substance Use: Not Applicable Psych Involvement: No (comment)  Admission diagnosis:  Subdural bleeding (HCC) [I62.00] AMS (altered mental status) [R41.82] Patient Active Problem List   Diagnosis Date Noted   AMS (altered mental status) 06/30/2021   Subdural bleeding (HCC) 06/29/2021   Leukocytosis 06/29/2021   Subdural hematoma    Fall at home, initial encounter    Seizure [facility])    PCP:  System, Provider Not In Pharmacy:   [facility] [phone] [place], [place] - [address] [place] [address] Phone: [phone] Fax: [phone]     Social Determinants of Health (SDOH) Interventions    Readmission Risk Interventions No flowsheet data found.

## 2021-06-30 NOTE — ED Notes (Signed)
Patient transported to CT 

## 2021-06-30 NOTE — ED Notes (Signed)
Pt resting comfortably in bed at this time. Pt is alert and oriented with even and regular respirations. No acute distress noted. Pt denies any needs at this time. Pt remains connected to pure wick. Call light within reach.

## 2021-07-01 DIAGNOSIS — E871 Hypo-osmolality and hyponatremia: Secondary | ICD-10-CM

## 2021-07-01 DIAGNOSIS — I6200 Nontraumatic subdural hemorrhage, unspecified: Secondary | ICD-10-CM

## 2021-07-01 DIAGNOSIS — I609 Nontraumatic subarachnoid hemorrhage, unspecified: Secondary | ICD-10-CM

## 2021-07-01 DIAGNOSIS — I10 Essential (primary) hypertension: Secondary | ICD-10-CM

## 2021-07-01 MED ORDER — LEVETIRACETAM 500 MG PO TABS: 500.0000 mg | ORAL_TABLET | Freq: Two times a day (BID) | ORAL | 0 refills | Status: DC

## 2021-07-01 NOTE — TOC Transition Note (Addendum)
Transition of Care [facility]) - CM/SW Discharge Note   Patient Details  Name: Mallory Oconnor MRN: [id number] Date of Birth: 10-04-1931  Transition of Care [facility]) CM/SW Contact:  Candie Chroman, LCSW Phone Number: 07/01/2021, 12:38 PM   Clinical Narrative:   Patient has orders to discharge home today. Met with patient and daughter at bedside. They are agreeable to home health PT. [facility] provides home therapy at [place]. Faxed orders to [employer]. Patient will go home with daughter through Christmas and then return to [place]. Ordered 3-in-1 through [employer] to be delivered to the room. No further concerns. CSW signing off.  3:23 pm: Patient refused 3-in-1 when it was delivered. Daughter was not at bedside at the time. Patient now agreeable. Also requesting a RW. Ordered through [employer]. CSW signing off.  Final next level of care: [place] Barriers to Discharge: Barriers Resolved   Patient Goals and CMS Choice Patient states their goals for this hospitalization and ongoing recovery are:: To return to [facility] independent living at discharge with PT CMS Medicare.gov Compare Post Acute Care list provided to:: Patient Choice offered to / list presented to : NA  Discharge Placement                Patient to be transferred to facility by: Daughter Name of family member notified: [name] Patient and family notified of of transfer: 07/01/21  Discharge Plan and Services   Discharge Planning Services: CM Consult Post Acute Care Choice: Home Health          DME Arranged: 3-N-1 DME Agency: [employer] Date DME Agency Contacted: 07/01/21   Representative spoke with at DME Agency: [place]: PT [place]: 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

## 2021-07-01 NOTE — Plan of Care (Signed)
I attempted to see patient x2 today and both times she was OTF. Per NSU examination this AM, patient had some mild persistent WFD, no new deficits.  Imaging   MRI:   1. When comparing across modalities, similar extra-axial hemorrhage overlying the left frontal convexity (likely subdural and subarachnoid), measuring up to approximately 6 mm in thickness. Trace extra-axial hemorrhage overlying the right posterior cerebral convexity, 5 mm in thickness and not visible on prior CT. No significant mass effect. 2. No acute infarct. 3. Paranasal sinus disease, described above.   MRA head:   No large vessel occlusion or proximal high-grade stenosis.   MRA neck:   Patent major arteries in the neck. Evaluation proximally is limited by artifact without definite visible hemodynamically significant stenosis. A CTA could provide more sensitive evaluation if clinically indicated.  Imaging personally reviewed; I agree with above interpretation.  rEEG is pending. I will f/u with patient in the AM. Continue keppra.  Bing Neighbors, MD [facility] [phone]  If 7pm- 7am, please page neurology on call as listed in AMION.

## 2021-07-01 NOTE — Progress Notes (Signed)
° °   Attending Progress Note  History: Tamura Guinea-Bissau is a 87 y.o F presenting after an episode of weakness and speech difficulty. CT head showed small acute left sided SDH.  Physical Exam: Vitals:   07/01/21 0537 07/01/21 0734  BP: (!) 146/57 (!) 145/69  Pulse: 71 69  Resp: 18 16  Temp: 98.5 F (36.9 C) 97.7 F (36.5 C)  SpO2: 99% 99%    AA Ox3 CNI Strength:5/5 throughout  No pronator drift.   Data:  Recent Labs  Lab 06/29/21 1850 06/30/21 0632  NA 132* 135  K 5.1 4.3  CL 98 98  CO2 25 28  BUN 26* 22  CREATININE 0.67 0.58  GLUCOSE 130* 141*  CALCIUM 9.5 9.5   Recent Labs  Lab 06/29/21 1850  AST 27  ALT 6  ALKPHOS 69     Recent Labs  Lab 06/29/21 1850 06/30/21 0632  WBC 11.4* 11.2*  HGB 11.0* 10.9*  HCT 34.0* 34.1*  PLT 376 370   Recent Labs  Lab 06/29/21 1850  APTT 40*  INR 1.2         Assessment/Plan:  Marion Guinea-Bissau is an 87 y.o presenting with weakness and speech difficulty and found to have a small left sided SDH. She speech appears to be much improved this morning   - Keppra ppx x 7 days - plan for f/u outpatient in 2-3 weeks. - no plan for neurosurgical intervention at this time - further POC per neurology and medicine team - please call with any questions or concerns.   Manning Charity PA-C Department of Neurosurgery

## 2021-07-01 NOTE — Discharge Instructions (Signed)
No blood thinners (aspirin, ibuprofen, goody powder, bc powder, alleve) Tylenol as needed for pain

## 2021-07-01 NOTE — TOC Progression Note (Signed)
Transition of Care [facility]) - Progression Note    Patient Details  Name: Mallory Oconnor MRN: [id number] Date of Birth: 1931-08-29  Transition of Care [facility]) CM/SW Contact  Margarito Liner, LCSW Phone Number: 07/01/2021, 11:24 AM  Clinical Narrative:   Left voicemail for daughter. Will discuss home health and DME recommendations when she calls back.  Expected Discharge Plan: Home w Home Health Services Barriers to Discharge: Continued Medical Work up  Expected Discharge Plan and Services Expected Discharge Plan: Home w Home Health Services   Discharge Planning Services: CM Consult Post Acute Care Choice: Home Health Living arrangements for the past 2 months: Independent Living Facility Expected Discharge Date: 07/01/21               DME Arranged: N/A DME Agency: NA       HH Arranged: PT, OT           Social Determinants of Health (SDOH) Interventions    Readmission Risk Interventions No flowsheet data found.

## 2021-07-01 NOTE — Evaluation (Signed)
Physical Therapy Evaluation Patient Details Name: Mallory Oconnor MRN: [id number] DOB: [birth year] Today's Date: 07/01/2021  History of Present Illness  Mallory Oconnor is a 87 y.o. female with medical history significant for hypertension, from [facility], assisted living facility who presents to [place] for chief concerns of expressive aphasia and left upper extremity weakness during dinner after a fall per patient in EMR "a few days ago". Reported it may have occured on the is past saturday. Neuro consult stating SDH stable with no surgery indicated.   Clinical Impression  Pt admitted with above diagnosis. Pt received in bed agreeable to PT. Minor difficulty noted with word finding during subjective but overall able to report PLOF, DME, home lay out at [facility] and home a[place] out of daughter's house without difficulty. Did require brief phone call to daughter post session to clarify stair set up but overall pt accurate in home lay out. At baseline pt is mod-I with all mobility and ADL's/IADL's with use of rollator for mobility, does not drive, and receives meals at ALF. HR trending in 70's BPM at rest up to mid 90's with mobility. Able to transfers with supervision to [facility] and stand with minguard to RW with VC's for hand placement. Upon standing pt endorsing active urination. Amb to bathroom with supervision and supervision with toilet transfer with indep peri care. Able to stand with supervision from toilet with use og guard rail and amb 160' with RW with reciprocal gait with good steadiness and RW sequencing with turns and transfers. Pt returned to recliner in room with all needs in reach. Will benefit from [facility] PT to maintain indep at ALF and to improve safety with transfers to reduce risk of falls. Pt currently with functional limitations due to the deficits listed below (see PT Problem List). Pt will benefit from skilled PT to increase their independence and safety with mobility to allow discharge to  the venue listed below.      Recommendations for follow up therapy are one component of a multi-disciplinary discharge planning process, led by the attending physician.  Recommendations may be updated based on patient status, additional functional criteria and insurance authorization.  Follow Up Recommendations Home health PT    Assistance Recommended at Discharge Intermittent Supervision/Assistance  Functional Status Assessment    Equipment Recommendations  BSC/3in1    Recommendations for Other Services       Precautions / Restrictions Precautions Precautions: Fall Restrictions Weight Bearing Restrictions: No      Mobility  Bed Mobility Overal bed mobility: Needs Assistance Bed Mobility: Supine to Sit     Supine to sit: Supervision;HOB elevated     General bed mobility comments: increased time, no need of bed rail. Patient Response: Cooperative  Transfers Overall transfer level: Needs assistance Equipment used: Rolling walker (2 wheels) Transfers: Sit to/from Stand Sit to Stand: Min guard           General transfer comment: min VC's for hand placement    Ambulation/Gait Ambulation/Gait assistance: Supervision Gait Distance (Feet): 180 Feet Assistive device: Rolling walker (2 wheels) Gait Pattern/deviations: Step-through pattern       General Gait Details: Ambulates with consistent cadence, able to actively perform horizontal head turns without LOB or changes in gait speed  Stairs            Wheelchair Mobility    Modified Rankin (Stroke Patients Only)       Balance Overall balance assessment: Needs assistance Sitting-balance support: No upper extremity supported;Feet supported Sitting balance-Leahy  Scale: Good Sitting balance - Comments: able to don R sock without LOB at EOB   Standing balance support: During functional activity;Bilateral upper extremity supported Standing balance-Leahy Scale: Fair Standing balance comment: use of UE's  on RW                             Pertinent Vitals/Pain Pain Assessment: No/denies pain    Home Living Family/patient expects to be discharged to:: Assisted living                 Home Equipment: BSC/3in1      Prior Function Prior Level of Function : Independent/Modified Independent             Mobility Comments: Ambulates with rollator at baseline within facility. Facility provides cooking and meals ADLs Comments: no assist except for meals.     Hand Dominance        Extremity/Trunk Assessment   Upper Extremity Assessment Upper Extremity Assessment: Overall WFL for tasks assessed;RUE deficits/detail;LUE deficits/detail RUE Sensation: WNL RUE Coordination: WNL LUE Sensation: WNL LUE Coordination: WNL    Lower Extremity Assessment Lower Extremity Assessment: Generalized weakness    Cervical / Trunk Assessment Cervical / Trunk Assessment: Normal  Communication   Communication: Expressive difficulties  Cognition Arousal/Alertness: Awake/alert Behavior During Therapy: WFL for tasks assessed/performed Overall Cognitive Status: Within Functional Limits for tasks assessed                                          General Comments      Exercises Other Exercises Other Exercises: Role of PT in acute setting, DME recs, D/c Recs, safe use of DME   Assessment/Plan    PT Assessment Patient needs continued PT services  PT Problem List Decreased strength       PT Treatment Interventions DME instruction;Therapeutic exercise;Gait training;Balance training;Stair training;Neuromuscular re-education;Functional mobility training;Therapeutic activities;Patient/family education    PT Goals (Current goals can be found in the Care Plan section)  Acute Rehab PT Goals Patient Stated Goal: to go home for the holidays PT Goal Formulation: With patient Time For Goal Achievement: 07/15/21 Potential to Achieve Goals: Good    Frequency Min  2X/week   Barriers to discharge        Co-evaluation               AM-PAC PT "6 Clicks" Mobility  Outcome Measure Help needed turning from your back to your side while in a flat bed without using bedrails?: A Little Help needed moving from lying on your back to sitting on the side of a flat bed without using bedrails?: A Little Help needed moving to and from a bed to a chair (including a wheelchair)?: A Little Help needed standing up from a chair using your arms (e.g., wheelchair or bedside chair)?: A Little Help needed to walk in hospital room?: A Little Help needed climbing 3-5 steps with a railing? : A Lot 6 Click Score: 17    End of Session Equipment Utilized During Treatment: Gait belt Activity Tolerance: Patient tolerated treatment well Patient left: in chair;with call bell/phone within reach Nurse Communication: Mobility status PT Visit Diagnosis: Unsteadiness on feet (R26.81);Other abnormalities of gait and mobility (R26.89);Muscle weakness (generalized) (M62.81)    Time: 1017-5102 PT Time Calculation (min) (ACUTE ONLY): 31 min   Charges:   PT Evaluation $  PT Eval Moderate Complexity: 1 Mod PT Treatments $Gait Training: 8-22 mins        Delphia Grates. Fairly IV, PT, DPT Physical Therapist- [place]  [facility]  07/01/2021, 10:46 AM

## 2021-07-01 NOTE — Plan of Care (Signed)
Neurology plan of care  EEG showed mild diffuse slowing, mild L frontal focal slowing, and no epileptiform discharges. No further inpatient neurologic workup indicated at this time. Keppra per neurosurgery recommendations. F/u with NSU.  Bing Neighbors, MD [facility] [phone]  If 7pm- 7am, please page neurology on call as listed in AMION.

## 2021-07-01 NOTE — Discharge Summary (Signed)
[facility] [place] [facility]   PATIENT NAME: Mallory Oconnor    MR#:  [id number]  DATE OF BIRTH:  19-Jan-1932  DATE OF ADMISSION:  06/29/2021 ADMITTING PHYSICIAN: Enedina Finner, MD  DATE OF DISCHARGE: 07/01/2021  PRIMARY CARE PHYSICIAN: System, Provider Not In    ADMISSION DIAGNOSIS:  Subarachnoid hemorrhage (HCC) [I60.9] Subdural hematoma [S06.5XAA] Seizure-like activity (HCC) [R56.9] Subdural bleeding (HCC) [I62.00] AMS (altered mental status) [R41.82]  DISCHARGE DIAGNOSIS:  Principal Problem:   Subdural bleeding (HCC) Active Problems:   Subdural hematoma   Fall at home, initial encounter   Seizure (HCC)   Leukocytosis   AMS (altered mental status)   SECONDARY DIAGNOSIS:   Past Medical History:  Diagnosis Date   Hypertension     HOSPITAL COURSE:   Subdural and subarachnoid hemorrhage.  Patient hit her head a few days prior to coming in.  Patient was seen in consultation by neurology Dr. Selina Cooley and neurosurgery Dr. Adriana Simas.  Follow-up with neurosurgery in 2 weeks and no acute intervention needed at this time.  Neurosurgery recommended 1 week of Keppra.  MRI of the brain showed right posterior cerebral convexity subdural hematoma 5 mm in thickness no significant mass-effect.  Extra-axial hemorrhage overlying the left frontal convexity showing subarachnoid hemorrhage.  The patient did have some aphasia and needed to talk slowly in order to get her words out correctly.  She did well with physical therapy and walked 180 feet with a rolling walker.  The patient will go home with her daughter initially and then go to back to [facility] independent living.  Rolling walker and 3 and 1 ordered.  No blood thinners for 2 weeks (no aspirin, ibuprofen Aleve Motrin, Goody powder or BC powder).  EEG reviewed by neurology and no epileptiform abnormalities were seen during the recording.  Did have global cerebral slowing.  Case discussed with neurology and no changes in the  Keppra recommendation of 1 week. Essential hypertension on metoprolol Hyponatremia.  Sodium 132 on presentation and up to 135 upon disposition.  Hold hydrochlorothiazide.  DISCHARGE CONDITIONS:   Satisfactory  CONSULTS OBTAINED:  Neurosurgery Neurology  DRUG ALLERGIES:  Not on File  DISCHARGE MEDICATIONS:   Allergies as of 07/01/2021   Not on File      Medication List     STOP taking these medications    Glucosamine-Chondroitin 500-400 MG Caps   hydrochlorothiazide 25 MG tablet Commonly known as: HYDRODIURIL   Melatonin 3 MG Tbdp       TAKE these medications    ascorbic acid 500 MG tablet Commonly known as: VITAMIN C Take 1 tablet by mouth daily.   Calcium Carb-Cholecalciferol 600-10 MG-MCG Tabs Take 1 tablet by mouth 2 (two) times daily.   Cholecalciferol 25 MCG (1000 UT) tablet Take 1 tablet by mouth daily.   Cinnamon 500 MG capsule Take 1 capsule by mouth daily.   ipratropium 0.03 % nasal spray Commonly known as: ATROVENT Place 2 sprays into both nostrils 2 (two) times daily.   levETIRAcetam 500 MG tablet Commonly known as: Keppra Take 1 tablet (500 mg total) by mouth 2 (two) times daily for 7 days.   metoprolol tartrate 50 MG tablet Commonly known as: LOPRESSOR Take 50 mg by mouth 2 (two) times daily.   multivitamin tablet Take 1 tablet by mouth daily.   PRESERVISION AREDS 2 PO Take 1 tablet by mouth 2 (two) times daily.               Durable  Medical Equipment  (From admission, onward)           Start     Ordered   07/01/21 1519  For home use only DME Walker rolling  Once       Question Answer Comment  Walker: With 5 Inch Wheels   Patient needs a walker to treat with the following condition Subdural hematoma      07/01/21 1519   07/01/21 1215  For home use only DME 3 n 1  Once        07/01/21 1214             DISCHARGE INSTRUCTIONS:   Follow-up PMD 5 days Follow-up neurosurgery 2 weeks  If you experience  worsening of your admission symptoms, develop shortness of breath, life threatening emergency, suicidal or homicidal thoughts you must seek medical attention immediately by calling 911 or calling your MD immediately  if symptoms less severe.  You Must read complete instructions/literature along with all the possible adverse reactions/side effects for all the Medicines you take and that have been prescribed to you. Take any new Medicines after you have completely understood and accept all the possible adverse reactions/side effects.   Please note  You were cared for by a hospitalist during your hospital stay. If you have any questions about your discharge medications or the care you received while you were in the hospital after you are discharged, you can call the unit and asked to speak with the hospitalist on call if the hospitalist that took care of you is not available. Once you are discharged, your primary care physician will handle any further medical issues. Please note that NO REFILLS for any discharge medications will be authorized once you are discharged, as it is imperative that you return to your primary care physician (or establish a relationship with a primary care physician if you do not have one) for your aftercare needs so that they can reassess your need for medications and monitor your lab values.    Today   CHIEF COMPLAINT:   Chief Complaint  Patient presents with   Code Stroke    HISTORY OF PRESENT ILLNESS:  Mallory Oconnor  is a 87 y.o. female was brought in with aphasia and found to have subarachnoid hemorrhage and subdural hematoma   VITAL SIGNS:  Blood pressure (!) 133/55, pulse 69, temperature (!) 97.5 F (36.4 C), temperature source Oral, resp. rate 16, height 5' (1.524 m), weight 74.8 kg, SpO2 97 %.  I/O:   Intake/Output Summary (Last 24 hours) at 07/01/2021 1805 Last data filed at 07/01/2021 1418 Gross per 24 hour  Intake 100 ml  Output --  Net 100 ml     PHYSICAL EXAMINATION:  GENERAL:  87 y.o.-year-old patient lying in the bed with no acute distress.  EYES: Pupils equal, round, reactive to light and accommodation. No scleral icterus.  HEENT: Head atraumatic, normocephalic. Oropharynx and nasopharynx clear.  LUNGS: Normal breath sounds bilaterally, no wheezing, rales,rhonchi or crepitation. No use of accessory muscles of respiration.  CARDIOVASCULAR: S1, S2 normal. No murmurs, rubs, or gallops.  ABDOMEN: Soft, non-tender, non-distended.  EXTREMITIES: No pedal edema.  NEUROLOGIC: Cranial nerves II through XII are intact.  Speech slow and deliberate but able to get out her words clearly.  Muscle strength 5/5 in all extremities. Sensation intact. Gait not checked.  PSYCHIATRIC: The patient is alert and oriented x 3.  SKIN: No obvious rash, lesion, or ulcer.   DATA REVIEW:   CBC Recent  Labs  Lab 06/30/21 0632  WBC 11.2*  HGB 10.9*  HCT 34.1*  PLT 370    Chemistries  Recent Labs  Lab 06/29/21 1850 06/29/21 2330 06/30/21 0632  NA 132*  --  135  K 5.1  --  4.3  CL 98  --  98  CO2 25  --  28  GLUCOSE 130*  --  141*  BUN 26*  --  22  CREATININE 0.67  --  0.58  CALCIUM 9.5  --  9.5  MG  --  2.2  --   AST 27  --   --   ALT 6  --   --   ALKPHOS 69  --   --   BILITOT 0.9  --   --     Microbiology Results  Results for orders placed or performed during the hospital encounter of 06/29/21  Resp Panel by RT-PCR (Flu A&B, Covid) Nasopharyngeal Swab     Status: None   Collection Time: 06/29/21 11:55 PM   Specimen: Nasopharyngeal Swab; Nasopharyngeal(NP) swabs in vial transport medium  Result Value Ref Range Status   SARS Coronavirus 2 by RT PCR NEGATIVE NEGATIVE Final    Comment: (NOTE) SARS-CoV-2 target nucleic acids are NOT DETECTED.  The SARS-CoV-2 RNA is generally detectable in upper respiratory specimens during the acute phase of infection. The lowest concentration of SARS-CoV-2 viral copies this assay can detect  is 138 copies/mL. A negative result does not preclude SARS-Cov-2 infection and should not be used as the sole basis for treatment or other patient management decisions. A negative result may occur with  improper specimen collection/handling, submission of specimen other than nasopharyngeal swab, presence of viral mutation(s) within the areas targeted by this assay, and inadequate number of viral copies(<138 copies/mL). A negative result must be combined with clinical observations, patient history, and epidemiological information. The expected result is Negative.  Fact Sheet for Patients:  BloggerCourse.com  Fact Sheet for Healthcare Providers:  SeriousBroker.it  This test is no t yet approved or cleared by the [place] FDA and  has been authorized for detection and/or diagnosis of SARS-CoV-2 by FDA under an Emergency Use Authorization (EUA). This EUA will remain  in effect (meaning this test can be used) for the duration of the COVID-19 declaration under Section 564(b)(1) of the Act, 21 U.S.C.section 360bbb-3(b)(1), unless the authorization is terminated  or revoked sooner.       Influenza A by PCR NEGATIVE NEGATIVE Final   Influenza B by PCR NEGATIVE NEGATIVE Final    Comment: (NOTE) The Xpert Xpress SARS-CoV-2/FLU/RSV plus assay is intended as an aid in the diagnosis of influenza from Nasopharyngeal swab specimens and should not be used as a sole basis for treatment. Nasal washings and aspirates are unacceptable for Xpert Xpress SARS-CoV-2/FLU/RSV testing.  Fact Sheet for Patients: BloggerCourse.com  Fact Sheet for Healthcare Providers: SeriousBroker.it  This test is not yet approved or cleared by the [place] FDA and has been authorized for detection and/or diagnosis of SARS-CoV-2 by FDA under an Emergency Use Authorization (EUA). This EUA will remain in effect  (meaning this test can be used) for the duration of the COVID-19 declaration under Section 564(b)(1) of the Act, 21 U.S.C. section 360bbb-3(b)(1), unless the authorization is terminated or revoked.  Performed at [facility], [address] [place], [place] [address]     RADIOLOGY:  CT HEAD WO CONTRAST ( )  Result Date: 06/30/2021 CLINICAL DATA:  Follow-up examination for intracranial hemorrhage. EXAM: CT  HEAD WITHOUT CONTRAST TECHNIQUE: Contiguous axial images were obtained from the base of the skull through the vertex without intravenous contrast. COMPARISON:  CT from 06/29/2021. FINDINGS: Brain: Previously identified extra-axial hemorrhage overlying the left cerebral convexity again seen, not significantly changed in size or morphology as compared to prior. Hemorrhage measures up to 6 mm in maximal thickness, stable. Mild mass effect on the subjacent left cerebral hemisphere without significant midline shift. Probable trace subarachnoid hemorrhage within the underlying left frontal lobe noted, also stable (series 2, image 17). No other new acute intracranial hemorrhage. No acute large vessel territory infarct. No mass lesion or hydrocephalus. Atrophy with chronic microvascular ischemic disease again noted. Vascular: No hyperdense vessel. Skull: Scalp soft tissues and calvarium demonstrate no new finding. Sinuses/Orbits: Globes and orbital soft tissues demonstrate no acute finding. Chronic left ethmoidal and maxillary sinusitis partially visualized. Mastoid air cells remain clear. Other: None. IMPRESSION: 1. No significant interval change in extra-axial hemorrhage overlying the left cerebral convexity measuring up to 6 mm in maximal thickness. Mild mass effect on the subjacent left cerebral hemisphere without significant midline shift. 2. Associated trace subarachnoid hemorrhage within the underlying left frontal lobe, also stable. 3. No other new acute intracranial abnormality.  Electronically Signed   By: Rise Mu M.D.   On: 06/30/2021 00:45   MR ANGIO HEAD WO CONTRAST  Result Date: 06/30/2021 CLINICAL DATA:  Neuro deficit, acute, stroke suspected; Carotid artery stenosis screening, risk factors; Transient ischemic attack (TIA) Seizure, new-onset, history of trauma Stroke, follow up Neuro deficit, acute, stroke suspected EXAM: MRI HEAD WITHOUT AND WITH CONTRAST MRA HEAD WITHOUT CONTRAST MRA OF THE NECK WITHOUT AND WITH CONTRAST TECHNIQUE: Multiplanar, multi-echo pulse sequences of the brain and surrounding structures were acquired without and with intravenous contrast. Angiographic images of the Circle of Willis were acquired using MRA technique intravenous contrast. Angiographic images of the neck were acquired using MRA technique without and with intravenous contrast. Carotid stenosis measurements (when applicable) are obtained utilizing NASCET criteria, using the distal internal carotid diameter as the denominator. CONTRAST:  7mL GADAVIST GADOBUTROL 1 MMOL/ML IV SOLN COMPARISON:  CT head 06/30/2021. FINDINGS: MR HEAD FINDINGS Brain: When comparing across modalities, similar extra-axial hemorrhage overlying the left frontal convexity, measuring up to approximately 6 mm in thickness. Similar adjacent small volume left frontal convexity subarachnoid hemorrhage. Trace extra-axial hemorrhage overlying the right posterior cerebral convexity, 5 mm in thickness and not visible on prior CT. No significant mass effect. No midline shift. No hydrocephalus. No mass lesion. No abnormal intraparenchymal enhancement. Mildly conspicuous sulcal vessels along the left frontal convexity, likely reactive from the hemorrhage. Mild diffuse dural thickening enhancement/thickening, which is also favored reactive due to the aforementioned hemorrhage. Partially empty sella. Vascular: See below. Skull and upper cervical spine: Normal marrow signal. Sinuses/Orbits: Opacification scattered ethmoid  air cells. Complete opacification of the left maxillary sinus. MRA HEAD FINDINGS Motion limited evaluation. Anterior circulation: Bilateral intracranial ICAs, MCAs, and ACAs are patent without proximal high-grade stenosis. No aneurysm identified. Posterior circulation: Bilateral intradural vertebral arteries, basilar artery, and bilateral posterior cerebral arteries are patent without proximal high-grade stenosis. No aneurysm identified. MRA NECK FINDINGS Aortic arch: Great vessel origins are patent. Limited assessment by MRA without evidence of high-grade stenosis. Right carotid system: Patent. Apparent narrowing of the common carotid artery may be artifactual given motion artifact this region. Otherwise, no evidence significant stenosis. Mild ICA narrowing. Tortuous ICA. Left carotid system: Limited evaluation proximally due to artifact. No evidence of significant stenosis. Vertebral arteries:  Limited evaluation proximally due to artifact. The vertebral arteries are patent without evidence of significant stenosis. The vertebral arteries are tortuous bilaterally. IMPRESSION: MRI: 1. When comparing across modalities, similar extra-axial hemorrhage overlying the left frontal convexity (likely subdural and subarachnoid), measuring up to approximately 6 mm in thickness. Trace extra-axial hemorrhage overlying the right posterior cerebral convexity, 5 mm in thickness and not visible on prior CT. No significant mass effect. 2. No acute infarct. 3. Paranasal sinus disease, described above. MRA head: No large vessel occlusion or proximal high-grade stenosis. MRA neck: Patent major arteries in the neck. Evaluation proximally is limited by artifact without definite visible hemodynamically significant stenosis. A CTA could provide more sensitive evaluation if clinically indicated. Electronically Signed   By: Feliberto Harts M.D.   On: 06/30/2021 11:49   MR ANGIO NECK W WO CONTRAST  Result Date: 06/30/2021 CLINICAL DATA:   Neuro deficit, acute, stroke suspected; Carotid artery stenosis screening, risk factors; Transient ischemic attack (TIA) Seizure, new-onset, history of trauma Stroke, follow up Neuro deficit, acute, stroke suspected EXAM: MRI HEAD WITHOUT AND WITH CONTRAST MRA HEAD WITHOUT CONTRAST MRA OF THE NECK WITHOUT AND WITH CONTRAST TECHNIQUE: Multiplanar, multi-echo pulse sequences of the brain and surrounding structures were acquired without and with intravenous contrast. Angiographic images of the Circle of Willis were acquired using MRA technique intravenous contrast. Angiographic images of the neck were acquired using MRA technique without and with intravenous contrast. Carotid stenosis measurements (when applicable) are obtained utilizing NASCET criteria, using the distal internal carotid diameter as the denominator. CONTRAST:  53mL GADAVIST GADOBUTROL 1 MMOL/ML IV SOLN COMPARISON:  CT head 06/30/2021. FINDINGS: MR HEAD FINDINGS Brain: When comparing across modalities, similar extra-axial hemorrhage overlying the left frontal convexity, measuring up to approximately 6 mm in thickness. Similar adjacent small volume left frontal convexity subarachnoid hemorrhage. Trace extra-axial hemorrhage overlying the right posterior cerebral convexity, 5 mm in thickness and not visible on prior CT. No significant mass effect. No midline shift. No hydrocephalus. No mass lesion. No abnormal intraparenchymal enhancement. Mildly conspicuous sulcal vessels along the left frontal convexity, likely reactive from the hemorrhage. Mild diffuse dural thickening enhancement/thickening, which is also favored reactive due to the aforementioned hemorrhage. Partially empty sella. Vascular: See below. Skull and upper cervical spine: Normal marrow signal. Sinuses/Orbits: Opacification scattered ethmoid air cells. Complete opacification of the left maxillary sinus. MRA HEAD FINDINGS Motion limited evaluation. Anterior circulation: Bilateral  intracranial ICAs, MCAs, and ACAs are patent without proximal high-grade stenosis. No aneurysm identified. Posterior circulation: Bilateral intradural vertebral arteries, basilar artery, and bilateral posterior cerebral arteries are patent without proximal high-grade stenosis. No aneurysm identified. MRA NECK FINDINGS Aortic arch: Great vessel origins are patent. Limited assessment by MRA without evidence of high-grade stenosis. Right carotid system: Patent. Apparent narrowing of the common carotid artery may be artifactual given motion artifact this region. Otherwise, no evidence significant stenosis. Mild ICA narrowing. Tortuous ICA. Left carotid system: Limited evaluation proximally due to artifact. No evidence of significant stenosis. Vertebral arteries: Limited evaluation proximally due to artifact. The vertebral arteries are patent without evidence of significant stenosis. The vertebral arteries are tortuous bilaterally. IMPRESSION: MRI: 1. When comparing across modalities, similar extra-axial hemorrhage overlying the left frontal convexity (likely subdural and subarachnoid), measuring up to approximately 6 mm in thickness. Trace extra-axial hemorrhage overlying the right posterior cerebral convexity, 5 mm in thickness and not visible on prior CT. No significant mass effect. 2. No acute infarct. 3. Paranasal sinus disease, described above. MRA head: No large  vessel occlusion or proximal high-grade stenosis. MRA neck: Patent major arteries in the neck. Evaluation proximally is limited by artifact without definite visible hemodynamically significant stenosis. A CTA could provide more sensitive evaluation if clinically indicated. Electronically Signed   By: Feliberto Harts M.D.   On: 06/30/2021 11:49   MR BRAIN W WO CONTRAST  Result Date: 06/30/2021 CLINICAL DATA:  Neuro deficit, acute, stroke suspected; Carotid artery stenosis screening, risk factors; Transient ischemic attack (TIA) Seizure, new-onset,  history of trauma Stroke, follow up Neuro deficit, acute, stroke suspected EXAM: MRI HEAD WITHOUT AND WITH CONTRAST MRA HEAD WITHOUT CONTRAST MRA OF THE NECK WITHOUT AND WITH CONTRAST TECHNIQUE: Multiplanar, multi-echo pulse sequences of the brain and surrounding structures were acquired without and with intravenous contrast. Angiographic images of the Circle of Willis were acquired using MRA technique intravenous contrast. Angiographic images of the neck were acquired using MRA technique without and with intravenous contrast. Carotid stenosis measurements (when applicable) are obtained utilizing NASCET criteria, using the distal internal carotid diameter as the denominator. CONTRAST:  7mL GADAVIST GADOBUTROL 1 MMOL/ML IV SOLN COMPARISON:  CT head 06/30/2021. FINDINGS: MR HEAD FINDINGS Brain: When comparing across modalities, similar extra-axial hemorrhage overlying the left frontal convexity, measuring up to approximately 6 mm in thickness. Similar adjacent small volume left frontal convexity subarachnoid hemorrhage. Trace extra-axial hemorrhage overlying the right posterior cerebral convexity, 5 mm in thickness and not visible on prior CT. No significant mass effect. No midline shift. No hydrocephalus. No mass lesion. No abnormal intraparenchymal enhancement. Mildly conspicuous sulcal vessels along the left frontal convexity, likely reactive from the hemorrhage. Mild diffuse dural thickening enhancement/thickening, which is also favored reactive due to the aforementioned hemorrhage. Partially empty sella. Vascular: See below. Skull and upper cervical spine: Normal marrow signal. Sinuses/Orbits: Opacification scattered ethmoid air cells. Complete opacification of the left maxillary sinus. MRA HEAD FINDINGS Motion limited evaluation. Anterior circulation: Bilateral intracranial ICAs, MCAs, and ACAs are patent without proximal high-grade stenosis. No aneurysm identified. Posterior circulation: Bilateral intradural  vertebral arteries, basilar artery, and bilateral posterior cerebral arteries are patent without proximal high-grade stenosis. No aneurysm identified. MRA NECK FINDINGS Aortic arch: Great vessel origins are patent. Limited assessment by MRA without evidence of high-grade stenosis. Right carotid system: Patent. Apparent narrowing of the common carotid artery may be artifactual given motion artifact this region. Otherwise, no evidence significant stenosis. Mild ICA narrowing. Tortuous ICA. Left carotid system: Limited evaluation proximally due to artifact. No evidence of significant stenosis. Vertebral arteries: Limited evaluation proximally due to artifact. The vertebral arteries are patent without evidence of significant stenosis. The vertebral arteries are tortuous bilaterally. IMPRESSION: MRI: 1. When comparing across modalities, similar extra-axial hemorrhage overlying the left frontal convexity (likely subdural and subarachnoid), measuring up to approximately 6 mm in thickness. Trace extra-axial hemorrhage overlying the right posterior cerebral convexity, 5 mm in thickness and not visible on prior CT. No significant mass effect. 2. No acute infarct. 3. Paranasal sinus disease, described above. MRA head: No large vessel occlusion or proximal high-grade stenosis. MRA neck: Patent major arteries in the neck. Evaluation proximally is limited by artifact without definite visible hemodynamically significant stenosis. A CTA could provide more sensitive evaluation if clinically indicated. Electronically Signed   By: Feliberto Harts M.D.   On: 06/30/2021 11:49   EEG adult  Result Date: 07/01/2021 Jefferson Fuel, MD     07/01/2021 12:58 PM Routine EEG Report Mallory Oconnor is a 87 y.o. female with a history of L subdural hematoma and  possible seizure who is undergoing an EEG to evaluate for seizures. Report: This EEG was acquired with electrodes placed according to the International 10-20 electrode system  (including Fp1, Fp2, F3, F4, C3, C4, P3, P4, O1, O2, T3, T4, T5, T6, A1, A2, Fz, Cz, Pz). The following electrodes were missing or displaced: none. The occipital dominant rhythm was 7-8 Hz. This activity is reactive to stimulation. Drowsiness was manifested by background fragmentation; deeper stages of sleep were identified by K complexes and sleep spindles. There was mild superimposed focal slowing over the left frontal region. There were no interictal epileptiform discharges. There were no electrographic seizures identified. Photic stimulation and hyperventilation were not performed. Impression and clinical correlation: This EEG was obtained while awake and asleep and is abnormal due to mild diffuse slowing indicative of global cerebral dysfunction. Focal slowing over the left frontal region indicates superimposed cerebral dysfunction in that area. Epileptiform abnormalities were not seen during this recording. Bing Neighbors, MD [facility] [facility] [phone] If 7pm- 7am, please page neurology on call as listed in AMION.   CT HEAD CODE STROKE WO CONTRAST  Result Date: 06/29/2021 CLINICAL DATA:  Code stroke. Neuro deficit, acute, stroke suspected. Additional history provided: Slurred speech. EXAM: CT HEAD WITHOUT CONTRAST TECHNIQUE: Contiguous axial images were obtained from the base of the skull through the vertex without intravenous contrast. COMPARISON:  No pertinent prior exams available for comparison. FINDINGS: Brain: Mild generalized cerebral atrophy. Acute extra-axial hemorrhage (favored subdural) overlying the left cerebral hemisphere, greatest overlying the left frontal and parietal lobes, measuring up to 6 mm in thickness. Minimal mass effect upon the underlying left cerebral hemisphere. No midline shift. Adjacent small volume acute subarachnoid hemorrhage overlying the left frontal lobe. No demarcated cortical infarct. No extra-axial fluid collection. No evidence of an intracranial mass. No  midline shift. Vascular: No hyperdense vessel.  Atherosclerotic calcifications. Skull: Normal. Negative for fracture or focal lesion. Sinuses/Orbits: Visualized orbits show no acute finding. Complete opacification of the left maxillary sinus at the imaged levels. Small right sphenoid sinus mucous retention cyst. Partial opacification of the left ethmoid air cells. ASPECTS (Alberta Stroke Program Early CT Score) - Ganglionic level infarction (caudate, lentiform nuclei, internal capsule, insula, M1-M3 cortex): 7 - Supraganglionic infarction (M4-M6 cortex): 3 Total score (0-10 with 10 being normal): 10 These results were called by telephone at the time of interpretation on 06/29/2021 at 6:36 pm to provider Shaune Pollack , who verbally acknowledged these results. IMPRESSION: Acute extra-axial hemorrhage (likely subdural) overlying the left cerebral hemisphere (greatest overlying the left frontal and parietal lobes), measuring up to 6 mm in thickness. Minimal mass effect upon the underlying left cerebral hemisphere without midline shift. Adjacent small-volume acute subarachnoid hemorrhage overlying the left frontal lobe. No acute demarcated cortical infarct is identified. Mild generalized cerebral atrophy. Paranasal sinus disease at the imaged levels, as described. Electronically Signed   By: Jackey Loge D.O.   On: 06/29/2021 18:38      Management plans discussed with the patient, family and they are in agreement.  CODE STATUS:     Code Status Orders  (From admission, onward)           Start     Ordered   06/29/21 2036  Do not attempt resuscitation (DNR)  Continuous       Question Answer Comment  In the event of cardiac or respiratory ARREST Do not call a code blue   In the event of cardiac or respiratory ARREST Do not perform Intubation, CPR,  defibrillation or ACLS   In the event of cardiac or respiratory ARREST Use medication by any route, position, wound care, and other measures to relive pain  and suffering. May use oxygen, suction and manual treatment of airway obstruction as needed for comfort.      06/29/21 2035           Code Status History     Date Active Date Inactive Code Status Order ID Comments User Context   06/29/2021 1950 06/29/2021 2035 Full Code [id number]  Cox, Nadyne Coombes, DO ED      Advance Directive Documentation    Flowsheet Row Most Recent Value  Type of Advance Directive Out of facility DNR (pink MOST or yellow form)  Pre-existing out of facility DNR order (yellow form or pink MOST form) --  "MOST" Form in Place? --       TOTAL TIME TAKING CARE OF THIS PATIENT: 35 minutes.    Alford Highland M.D on 07/01/2021 at 6:05 PM   [facility]  CC: Primary care physician; System, Provider Not In

## 2021-07-01 NOTE — Procedures (Signed)
Routine EEG Report  Mallory Oconnor is a 87 y.o. female with a history of L subdural hematoma and possible seizure who is undergoing an EEG to evaluate for seizures.  Report: This EEG was acquired with electrodes placed according to the International 10-20 electrode system (including Fp1, Fp2, F3, F4, C3, C4, P3, P4, O1, O2, T3, T4, T5, T6, A1, A2, Fz, Cz, Pz). The following electrodes were missing or displaced: none.  The occipital dominant rhythm was 7-8 Hz. This activity is reactive to stimulation. Drowsiness was manifested by background fragmentation; deeper stages of sleep were identified by K complexes and sleep spindles. There was mild superimposed focal slowing over the left frontal region. There were no interictal epileptiform discharges. There were no electrographic seizures identified. Photic stimulation and hyperventilation were not performed.  Impression and clinical correlation: This EEG was obtained while awake and asleep and is abnormal due to mild diffuse slowing indicative of global cerebral dysfunction. Focal slowing over the left frontal region indicates superimposed cerebral dysfunction in that area. Epileptiform abnormalities were not seen during this recording.  Bing Neighbors, MD [facility] [phone]  If 7pm- 7am, please page neurology on call as listed in AMION.

## 2021-07-01 NOTE — Progress Notes (Signed)
Patient is being discharged home with daughter. Waiting in DME delivery  .

## 2021-07-02 LAB — VITAMIN B1: Vitamin B1 (Thiamine): 139.7 nmol/L (ref 66.5–200.0)

## 2021-07-31 ENCOUNTER — Other Ambulatory Visit: Payer: Self-pay | Admitting: Neurosurgery

## 2021-07-31 DIAGNOSIS — S065XAA Traumatic subdural hemorrhage with loss of consciousness status unknown, initial encounter: Secondary | ICD-10-CM

## 2021-08-21 ENCOUNTER — Ambulatory Visit
Admission: RE | Admit: 2021-08-21 | Discharge: 2021-08-21 | Disposition: A | Discharge status: Home / Self Care | Payer: Medicare Other | Source: Ambulatory Visit | Attending: Neurosurgery | Admitting: Neurosurgery

## 2021-08-21 ENCOUNTER — Other Ambulatory Visit: Payer: Self-pay

## 2021-08-21 DIAGNOSIS — S065XAA Traumatic subdural hemorrhage with loss of consciousness status unknown, initial encounter: Secondary | ICD-10-CM | POA: Diagnosis present

## 2022-04-15 ENCOUNTER — Inpatient Hospital Stay
Admission: EM | Admit: 2022-04-15 | Discharge: 2022-04-21 | DRG: 291 | Disposition: A | Discharge status: Home Health | Payer: Medicare Other | Attending: Internal Medicine | Admitting: Internal Medicine

## 2022-04-15 ENCOUNTER — Emergency Department: Payer: Medicare Other

## 2022-04-15 ENCOUNTER — Other Ambulatory Visit: Payer: Self-pay

## 2022-04-15 DIAGNOSIS — D649 Anemia, unspecified: Secondary | ICD-10-CM

## 2022-04-15 DIAGNOSIS — I110 Hypertensive heart disease with heart failure: Principal | ICD-10-CM | POA: Diagnosis present

## 2022-04-15 DIAGNOSIS — E663 Overweight: Secondary | ICD-10-CM | POA: Diagnosis present

## 2022-04-15 DIAGNOSIS — G9341 Metabolic encephalopathy: Secondary | ICD-10-CM | POA: Diagnosis present

## 2022-04-15 DIAGNOSIS — H9190 Unspecified hearing loss, unspecified ear: Secondary | ICD-10-CM | POA: Diagnosis present

## 2022-04-15 DIAGNOSIS — Z5919 Other inadequate housing: Secondary | ICD-10-CM

## 2022-04-15 DIAGNOSIS — W19XXXA Unspecified fall, initial encounter: Secondary | ICD-10-CM

## 2022-04-15 DIAGNOSIS — Z9181 History of falling: Secondary | ICD-10-CM

## 2022-04-15 DIAGNOSIS — Z87891 Personal history of nicotine dependence: Secondary | ICD-10-CM

## 2022-04-15 DIAGNOSIS — I080 Rheumatic disorders of both mitral and aortic valves: Secondary | ICD-10-CM | POA: Diagnosis present

## 2022-04-15 DIAGNOSIS — I447 Left bundle-branch block, unspecified: Secondary | ICD-10-CM | POA: Diagnosis present

## 2022-04-15 DIAGNOSIS — Z6826 Body mass index (BMI) 26.0-26.9, adult: Secondary | ICD-10-CM

## 2022-04-15 DIAGNOSIS — E871 Hypo-osmolality and hyponatremia: Secondary | ICD-10-CM | POA: Diagnosis present

## 2022-04-15 DIAGNOSIS — R531 Weakness: Secondary | ICD-10-CM | POA: Diagnosis present

## 2022-04-15 DIAGNOSIS — R410 Disorientation, unspecified: Secondary | ICD-10-CM

## 2022-04-15 DIAGNOSIS — Z79899 Other long term (current) drug therapy: Secondary | ICD-10-CM

## 2022-04-15 DIAGNOSIS — R4182 Altered mental status, unspecified: Secondary | ICD-10-CM | POA: Diagnosis present

## 2022-04-15 DIAGNOSIS — I951 Orthostatic hypotension: Secondary | ICD-10-CM | POA: Diagnosis present

## 2022-04-15 DIAGNOSIS — J189 Pneumonia, unspecified organism: Secondary | ICD-10-CM | POA: Diagnosis present

## 2022-04-15 DIAGNOSIS — R42 Dizziness and giddiness: Secondary | ICD-10-CM | POA: Diagnosis present

## 2022-04-15 DIAGNOSIS — D509 Iron deficiency anemia, unspecified: Secondary | ICD-10-CM | POA: Diagnosis present

## 2022-04-15 DIAGNOSIS — I10 Essential (primary) hypertension: Secondary | ICD-10-CM | POA: Diagnosis present

## 2022-04-15 DIAGNOSIS — I5031 Acute diastolic (congestive) heart failure: Secondary | ICD-10-CM

## 2022-04-15 DIAGNOSIS — Z66 Do not resuscitate: Secondary | ICD-10-CM | POA: Diagnosis present

## 2022-04-15 DIAGNOSIS — I509 Heart failure, unspecified: Secondary | ICD-10-CM

## 2022-04-15 DIAGNOSIS — D75839 Thrombocytosis, unspecified: Secondary | ICD-10-CM | POA: Diagnosis present

## 2022-04-15 DIAGNOSIS — S065X0S Traumatic subdural hemorrhage without loss of consciousness, sequela: Secondary | ICD-10-CM

## 2022-04-15 DIAGNOSIS — Z823 Family history of stroke: Secondary | ICD-10-CM

## 2022-04-15 DIAGNOSIS — R296 Repeated falls: Secondary | ICD-10-CM

## 2022-04-15 DIAGNOSIS — K219 Gastro-esophageal reflux disease without esophagitis: Secondary | ICD-10-CM | POA: Diagnosis present

## 2022-04-15 DIAGNOSIS — R079 Chest pain, unspecified: Secondary | ICD-10-CM

## 2022-04-15 LAB — CBC WITH DIFFERENTIAL/PLATELET
Abs Immature Granulocytes: 0.03 10*3/uL (ref 0.00–0.07)
Basophils Absolute: 0.1 10*3/uL (ref 0.0–0.1)
Basophils Relative: 1 %
Eosinophils Absolute: 0 10*3/uL (ref 0.0–0.5)
Eosinophils Relative: 0 %
HCT: 30.2 % — ABNORMAL LOW (ref 36.0–46.0)
Hemoglobin: 9 g/dL — ABNORMAL LOW (ref 12.0–15.0)
Immature Granulocytes: 0 %
Lymphocytes Relative: 19 %
Lymphs Abs: 1.6 10*3/uL (ref 0.7–4.0)
MCH: 23.4 pg — ABNORMAL LOW (ref 26.0–34.0)
MCHC: 29.8 g/dL — ABNORMAL LOW (ref 30.0–36.0)
MCV: 78.4 fL — ABNORMAL LOW (ref 80.0–100.0)
Monocytes Absolute: 1.4 10*3/uL — ABNORMAL HIGH (ref 0.1–1.0)
Monocytes Relative: 16 %
Neutro Abs: 5.5 10*3/uL (ref 1.7–7.7)
Neutrophils Relative %: 64 %
Platelets: 398 10*3/uL (ref 150–400)
RBC: 3.85 MIL/uL — ABNORMAL LOW (ref 3.87–5.11)
RDW: 18.6 % — ABNORMAL HIGH (ref 11.5–15.5)
WBC: 8.5 10*3/uL (ref 4.0–10.5)
nRBC: 0 % (ref 0.0–0.2)

## 2022-04-15 LAB — URINALYSIS, ROUTINE W REFLEX MICROSCOPIC
Bacteria, UA: NONE SEEN
Bilirubin Urine: NEGATIVE
Glucose, UA: NEGATIVE mg/dL
Hgb urine dipstick: NEGATIVE
Ketones, ur: NEGATIVE mg/dL
Nitrite: NEGATIVE
Protein, ur: 30 mg/dL — AB
Specific Gravity, Urine: 1.008 (ref 1.005–1.030)
pH: 7 (ref 5.0–8.0)

## 2022-04-15 LAB — COMPREHENSIVE METABOLIC PANEL
ALT: 15 U/L (ref 0–44)
AST: 21 U/L (ref 15–41)
Albumin: 2.8 g/dL — ABNORMAL LOW (ref 3.5–5.0)
Alkaline Phosphatase: 77 U/L (ref 38–126)
Anion gap: 9 (ref 5–15)
BUN: 16 mg/dL (ref 8–23)
CO2: 29 mmol/L (ref 22–32)
Calcium: 9.6 mg/dL (ref 8.9–10.3)
Chloride: 94 mmol/L — ABNORMAL LOW (ref 98–111)
Creatinine, Ser: 0.52 mg/dL (ref 0.44–1.00)
GFR, Estimated: >60 mL/min (ref >60)
Glucose, Bld: 147 mg/dL — ABNORMAL HIGH (ref 70–99)
Potassium: 4.1 mmol/L (ref 3.5–5.1)
Sodium: 132 mmol/L — ABNORMAL LOW (ref 135–145)
Total Bilirubin: 0.5 mg/dL (ref 0.3–1.2)
Total Protein: 9.2 g/dL — ABNORMAL HIGH (ref 6.5–8.1)

## 2022-04-15 LAB — TROPONIN I (HIGH SENSITIVITY)
Troponin I (High Sensitivity): 11 ng/L (ref <18)
Troponin I (High Sensitivity): 12 ng/L (ref <18)

## 2022-04-15 LAB — LACTIC ACID, PLASMA
Lactic Acid, Venous: 2 mmol/L (ref 0.5–1.9)
Lactic Acid, Venous: 1.3 mmol/L (ref 0.5–1.9)

## 2022-04-15 LAB — PROTIME-INR
INR: 1.5 — ABNORMAL HIGH (ref 0.8–1.2)
Prothrombin Time: 17.5 seconds — ABNORMAL HIGH (ref 11.4–15.2)

## 2022-04-15 LAB — BRAIN NATRIURETIC PEPTIDE: B Natriuretic Peptide: 499.5 pg/mL — ABNORMAL HIGH (ref 0.0–100.0)

## 2022-04-15 MED ORDER — ACETAMINOPHEN 325 MG PO TABS
650.0000 mg | ORAL_TABLET | ORAL | Status: DC | PRN
  Administered 2022-04-18 – 2022-04-20 (×4): 650 mg via ORAL
  Filled 2022-04-15 (×4): qty 2

## 2022-04-15 MED ORDER — DOXYCYCLINE HYCLATE 100 MG PO TABS
100.0000 mg | ORAL_TABLET | Freq: Two times a day (BID) | ORAL | Status: AC
  Administered 2022-04-15 – 2022-04-19 (×8): 100 mg via ORAL
  Filled 2022-04-15 (×8): qty 1

## 2022-04-15 MED ORDER — ASPIRIN 81 MG PO TBEC
81.0000 mg | DELAYED_RELEASE_TABLET | Freq: Every day | ORAL | Status: DC
  Administered 2022-04-16 – 2022-04-17 (×2): 81 mg via ORAL
  Filled 2022-04-15 (×2): qty 1

## 2022-04-15 MED ORDER — PANTOPRAZOLE SODIUM 40 MG PO TBEC
40.0000 mg | DELAYED_RELEASE_TABLET | Freq: Every day | ORAL | Status: DC
  Administered 2022-04-16 – 2022-04-21 (×6): 40 mg via ORAL
  Filled 2022-04-15 (×6): qty 1

## 2022-04-15 MED ORDER — MELATONIN 5 MG PO TABS
2.5000 mg | ORAL_TABLET | Freq: Every day | ORAL | Status: DC
  Administered 2022-04-15 – 2022-04-20 (×6): 2.5 mg via ORAL
  Filled 2022-04-15 (×6): qty 1

## 2022-04-15 MED ORDER — FUROSEMIDE 10 MG/ML IJ SOLN
20.0000 mg | Freq: Once | INTRAMUSCULAR | Status: AC
  Administered 2022-04-15: 20 mg via INTRAVENOUS
  Filled 2022-04-15: qty 4

## 2022-04-15 MED ORDER — ENOXAPARIN SODIUM 40 MG/0.4ML IJ SOSY
40.0000 mg | PREFILLED_SYRINGE | INTRAMUSCULAR | Status: DC
  Administered 2022-04-15 – 2022-04-20 (×6): 40 mg via SUBCUTANEOUS
  Filled 2022-04-15 (×6): qty 0.4

## 2022-04-15 MED ORDER — ONDANSETRON HCL 4 MG/2ML IJ SOLN: 4.0000 mg | Freq: Four times a day (QID) | INTRAMUSCULAR | Status: DC | PRN

## 2022-04-15 MED ORDER — ASPIRIN 300 MG RE SUPP: 300.0000 mg | RECTAL | Status: AC

## 2022-04-15 MED ORDER — ASPIRIN 81 MG PO CHEW
324.0000 mg | CHEWABLE_TABLET | ORAL | Status: AC
  Administered 2022-04-15: 324 mg via ORAL
  Filled 2022-04-15: qty 4

## 2022-04-15 MED ORDER — NITROGLYCERIN 0.4 MG SL SUBL: 0.4000 mg | SUBLINGUAL_TABLET | SUBLINGUAL | Status: DC | PRN

## 2022-04-15 MED ORDER — SODIUM CHLORIDE 0.9 % IV SOLN: Freq: Once | INTRAVENOUS | Status: DC

## 2022-04-15 MED ORDER — METOPROLOL TARTRATE 50 MG PO TABS
50.0000 mg | ORAL_TABLET | Freq: Two times a day (BID) | ORAL | Status: DC
  Administered 2022-04-15 – 2022-04-18 (×6): 50 mg via ORAL
  Filled 2022-04-15 (×2): qty 2
  Filled 2022-04-15 (×4): qty 1

## 2022-04-15 NOTE — H&P (Signed)
History and Physical    Patient: Mallory Oconnor XTG:[id number] DOB: [birth year] DOA: 04/15/2022 DOS: the patient was seen and examined on 04/15/2022 PCP: Virgie Dad, FNP  Patient coming from: ALF/ILF  Chief Complaint:  Chief Complaint  Patient presents with   Fatigue   Altered Mental Status    Pt. To ED from independent living facility for EKG changes, AMS, frequent falls, and generalized malaise. [name] states PCP called her this AM and states she had EKG changes from 2-3 days ago. Pt. Denies pain but states she feels exhausted. Pt. Was diagnosed with "lung infection" recently.    HPI: Mallory Oconnor is a 88 y.o. female with medical history significant for Hypertension, falls with injury including subdural bleed in December 2022, currently on doxycycline for a lung infection, who was sent to the ED because of an abnormal EKG after she presented with dizziness, an episode of vomiting the day prior and intermittent chest pain over the previous 4 days.  She has also been weak, confused and having frequent falls.  Patient's confusion has apparently been going on for about 2 weeks and started after she was moved out of her room at the facility because of bedbugs.  After she moved back into her room she continued to become confused and it seemed to be worsening.   ED course and data review: BP 135/58 with otherwise normal vitals.  Troponin 12, BNP 499.  WBC WNL, Hb 9, down from 11 9 months prior.  Urinalysis unremarkable, COVID and flu pending.  EKG, personally viewed and interpreted shows NSR at 79 with LVH shows ST elevations on V1 and V2, but this was present on V3 and V4 in December 2022 and probably related to LVH.  Chest x-ray showed trace bilateral pleural effusions with minimal basilar atelectasis and head CT showed no acute intracranial process.  Patient was treated with an IV Lasix 20 mg dose.  Hospitalist consulted for admission.   Review of Systems: As mentioned in the history  of present illness. All other systems reviewed and are negative.  Past Medical History:  Diagnosis Date   Hypertension    No past surgical history on file. Social History:  reports that she has quit smoking. Her smoking use included cigarettes. She has never used smokeless tobacco. She reports that she does not currently use alcohol. She reports that she does not use drugs.  No Known Allergies  Family History  Problem Relation Age of Onset   Stroke Maternal Grandmother    Stroke Paternal Grandmother     Prior to Admission medications   Medication Sig Start Date End Date Taking? Authorizing Provider  doxycycline (VIBRA-TABS) 100 MG tablet Take 100 mg by mouth 2 (two) times daily. 04/12/22 04/19/22 Yes [provider]  hydrochlorothiazide (HYDRODIURIL) 25 MG tablet Take 25 mg by mouth daily.   Yes [provider]  melatonin 3 MG TABS tablet Take 3 mg by mouth at bedtime.   Yes [provider]  metoprolol tartrate (LOPRESSOR) 50 MG tablet Take 50 mg by mouth 2 (two) times daily.   Yes [provider]  pantoprazole (PROTONIX) 40 MG tablet Take 40 mg by mouth daily.   Yes [provider]    Physical Exam: Vitals:   04/15/22 1207 04/15/22 1748 04/15/22 1930 04/15/22 1945  BP:  (!) 149/67 (!) 162/59   Pulse:  79 73 74  Resp:  17 (!) 24 (!) 22  Temp:  98.1 F (36.7 C)  TempSrc:  Oral    SpO2:  100% 99% 98%  Weight: 80.7 kg     Height: 5' (1.524 m)      Physical Exam Vitals and nursing note reviewed.  Constitutional:      General: She is sleeping. She is not in acute distress. HENT:     Head: Normocephalic and atraumatic.  Cardiovascular:     Rate and Rhythm: Normal rate and regular rhythm.     Heart sounds: Normal heart sounds.  Pulmonary:     Effort: Pulmonary effort is normal.     Breath sounds: Normal breath sounds.  Abdominal:     Palpations: Abdomen is soft.     Tenderness: There is no abdominal tenderness.  Neurological:      General: No focal deficit present.     Mental Status: She is easily aroused.     Labs on Admission: I have personally reviewed following labs and imaging studies  CBC: Recent Labs  Lab 04/15/22 1305  WBC 8.5  NEUTROABS 5.5  HGB 9.0*  HCT 30.2*  MCV 78.4*  PLT 398   Basic Metabolic Panel: Recent Labs  Lab 04/15/22 1305  NA 132*  K 4.1  CL 94*  CO2 29  GLUCOSE 147*  BUN 16  CREATININE 0.52  CALCIUM 9.6   GFR: Estimated Creatinine Clearance: 45.7 mL/min (by C-G formula based on SCr of 0.52 mg/dL). Liver Function Tests: Recent Labs  Lab 04/15/22 1305  AST 21  ALT 15  ALKPHOS 77  BILITOT 0.5  PROT 9.2*  ALBUMIN 2.8*   No results for input(s): "LIPASE", "AMYLASE" in the last 168 hours. No results for input(s): "AMMONIA" in the last 168 hours. Coagulation Profile: Recent Labs  Lab 04/15/22 1305  INR 1.5*   Cardiac Enzymes: No results for input(s): "CKTOTAL", "CKMB", "CKMBINDEX", "TROPONINI" in the last 168 hours. BNP (last 3 results) No results for input(s): "PROBNP" in the last 8760 hours. HbA1C: No results for input(s): "HGBA1C" in the last 72 hours. CBG: No results for input(s): "GLUCAP" in the last 168 hours. Lipid Profile: No results for input(s): "CHOL", "HDL", "LDLCALC", "TRIG", "CHOLHDL", "LDLDIRECT" in the last 72 hours. Thyroid Function Tests: No results for input(s): "TSH", "T4TOTAL", "FREET4", "T3FREE", "THYROIDAB" in the last 72 hours. Anemia Panel: No results for input(s): "VITAMINB12", "FOLATE", "FERRITIN", "TIBC", "IRON", "RETICCTPCT" in the last 72 hours. Urine analysis:    Component Value Date/Time   COLORURINE YELLOW (A) 04/15/2022 1210   APPEARANCEUR CLEAR (A) 04/15/2022 1210   LABSPEC 1.008 04/15/2022 1210   PHURINE 7.0 04/15/2022 1210   GLUCOSEU NEGATIVE 04/15/2022 1210   HGBUR NEGATIVE 04/15/2022 1210   BILIRUBINUR NEGATIVE 04/15/2022 1210   KETONESUR NEGATIVE 04/15/2022 1210   PROTEINUR 30 (A) 04/15/2022 1210    NITRITE NEGATIVE 04/15/2022 1210   LEUKOCYTESUR TRACE (A) 04/15/2022 1210    Radiological Exams on Admission: CT Head Wo Contrast  Result Date: 04/15/2022 CLINICAL DATA:  Altered level of consciousness, chest pain for 4 days, dizziness EXAM: CT HEAD WITHOUT CONTRAST TECHNIQUE: Contiguous axial images were obtained from the base of the skull through the vertex without intravenous contrast. RADIATION DOSE REDUCTION: This exam was performed according to the departmental dose-optimization program which includes automated exposure control, adjustment of the mA and/or kV according to patient size and/or use of iterative reconstruction technique. COMPARISON:  08/21/2021 FINDINGS: Brain: No acute infarct or hemorrhage. Lateral ventricles and midline structures are unremarkable. No acute extra-axial fluid collections. No mass effect. Vascular: No hyperdense vessel or unexpected  calcification. Skull: Normal. Negative for fracture or focal lesion. Sinuses/Orbits: Mucosal thickening left maxillary sinus and left ethmoid air cells. Other: None. IMPRESSION: 1. No acute intracranial process. Electronically Signed   By: Sharlet Salina M.D.   On: 04/15/2022 16:36   DG Chest 2 View  Result Date: 04/15/2022 CLINICAL DATA:  Chest pain, suspected sepsis, on antibiotics for UTI EXAM: CHEST - 2 VIEW COMPARISON:  04/15/2022 chest radiograph. FINDINGS: Stable cardiomediastinal silhouette with normal heart size. No pneumothorax. Trace bilateral pleural effusions with minimal bibasilar atelectasis. No pulmonary edema. IMPRESSION: Trace bilateral pleural effusions with minimal bibasilar atelectasis. Electronically Signed   By: Delbert Phenix M.D.   On: 04/15/2022 12:55     Data Reviewed: Relevant notes from primary care and specialist visits, past discharge summaries as available in EHR, including Care Everywhere. Prior diagnostic testing as pertinent to current admission diagnoses Updated medications and problem lists for  reconciliation ED course, including vitals, labs, imaging, treatment and response to treatment Triage notes, nursing and pharmacy notes and ED provider's notes Notable results as noted in HPI   Assessment and Plan: * AMS (altered mental status) No evidence of injury from recent fall No evidence of acute infection on work-up CT head no acute Neurologic checks overnight Physical therapy evaluation Anticipate discharge in 1 day   Chest pain Suspect noncardiac chest pain Presented with 2-week history of chest pain, generalized malaise and confusion and is currently chest pain-free.  Patient currently on doxycycline for recent ' lung infection' Troponin negative but BNP elevated to 499 and chest x-ray shows trace bilateral pleural effusions EKG shows findings that were present on EKG in 2022 We will get echocardiogram to evaluate for focal wall motion abnormality Continue outpatient doxycycline to completion  Anemia Hemoglobin of 9, down from 11 nine months prior Anemia panel Continue to trend  Falls Generalized weakness Patient has a history of a fall in December 2022 that resulted in a subdural bleed Unclear etiology to weakness and falls.  Possible recent lung infection.  Low suspicion for ACS.  Possible symptomatic anemia No evidence of injury from recent fall CT head no acute Neurologic checks overnight Physical therapy evaluation Anticipate discharge in 1 day  Essential hypertension Continue metoprolol 50 mg twice daily    DVT prophylaxis: Lovenox  Consults: none  Advance Care Planning:   Code Status: Prior   Family Communication: none  Disposition Plan: Back to previous home environment  Severity of Illness: The appropriate patient status for this patient is OBSERVATION. Observation status is judged to be reasonable and necessary in order to provide the required intensity of service to ensure the patient's safety. The patient's presenting symptoms, physical  exam findings, and initial radiographic and laboratory data in the context of their medical condition is felt to place them at decreased risk for further clinical deterioration. Furthermore, it is anticipated that the patient will be medically stable for discharge from the hospital within 2 midnights of admission.   Author: Andris Baumann, MD 04/15/2022 8:25 PM  For on call review [email or web address].

## 2022-04-15 NOTE — ED Triage Notes (Signed)
Pt. To ED from independent living facility for EKG changes, AMS, frequent falls, and generalized malaise. [name] states PCP called her this AM and states she had EKG changes from 2-3 days ago. Pt. Denies pain but states she feels exhausted. Pt. Was diagnosed with "lung infection" recently.

## 2022-04-15 NOTE — Assessment & Plan Note (Signed)
Continue metoprolol 50 mg twice daily 

## 2022-04-15 NOTE — ED Notes (Signed)
Pt given water and [place] sandwich at this time, daughter at bedisde

## 2022-04-15 NOTE — ED Provider Notes (Signed)
[facility] Provider Note    Event Date/Time   First MD Initiated Contact with Patient 04/15/22 1538     (approximate)   History   Fatigue and Altered Mental Status (Pt. To ED from independent living facility for EKG changes, AMS, frequent falls, and generalized malaise. [name] states PCP called her this AM and states she had EKG changes from 2-3 days ago. Pt. Denies pain but states she feels exhausted. Pt. Was diagnosed with "lung infection" recently.)   HPI  Cassady S [place] is a 88 y.o. female who comes here with her daughter from independent living at [facility].  Reportedly her EKG at [facility] looked different than previously her mental status has been decreasing with more confusion than usual.  This started when she had to move out of her regular room from bedbugs in the facility and then when she moved back to her room she is still more confused than usual which has been falling more than usual and she is kind of not feeling well.  She has been getting worse over the past approximately 2 weeks per her daughter.      Physical Exam   Triage Vital Signs: ED Triage Vitals  Enc Vitals Group     BP 04/15/22 1206 (!) 135/58     Pulse Rate 04/15/22 1206 73     Resp 04/15/22 1206 20     Temp 04/15/22 1206 98.1 F (36.7 C)     Temp Source 04/15/22 1206 Oral     SpO2 04/15/22 1206 100 %     Weight 04/15/22 1207 178 lb (80.7 kg)     Height 04/15/22 1207 5' (1.524 m)     Head Circumference --      Peak Flow --      Pain Score 04/15/22 1207 0     Pain Loc --      Pain Edu? --      Excl. in GC? --     Most recent vital signs: Vitals:   04/15/22 2100 04/15/22 2155  BP: 129/60 127/71  Pulse: 81 80  Resp: 19 20  Temp:  98.7 F (37.1 C)  SpO2: 96% 100%     General: Awake, alert no distress.  Head normocephalic atraumatic CV:  Good peripheral perfusion.  Heart regular rate and rhythm no audible murmurs Resp:  Normal effort.  Lungs clear  with possibly a couple crackles in the right lower lobe base Abd:  No distention.  Soft and nontender Extremities with trace edema   ED Results / Procedures / Treatments   Labs (all labs ordered are listed, but only abnormal results are displayed) Labs Reviewed  COMPREHENSIVE METABOLIC PANEL - Abnormal; Notable for the following components:      Result Value   Sodium 132 (*)    Chloride 94 (*)    Glucose, Bld 147 (*)    Total Protein 9.2 (*)    Albumin 2.8 (*)    All other components within normal limits  LACTIC ACID, PLASMA - Abnormal; Notable for the following components:   Lactic Acid, Venous 2.0 (*)    All other components within normal limits  CBC WITH DIFFERENTIAL/PLATELET - Abnormal; Notable for the following components:   RBC 3.85 (*)    Hemoglobin 9.0 (*)    HCT 30.2 (*)    MCV 78.4 (*)    MCH 23.4 (*)    MCHC 29.8 (*)    RDW 18.6 (*)  Monocytes Absolute 1.4 (*)    All other components within normal limits  PROTIME-INR - Abnormal; Notable for the following components:   Prothrombin Time 17.5 (*)    INR 1.5 (*)    All other components within normal limits  URINALYSIS, ROUTINE W REFLEX MICROSCOPIC - Abnormal; Notable for the following components:   Color, Urine YELLOW (*)    APPearance CLEAR (*)    Protein, ur 30 (*)    Leukocytes,Ua TRACE (*)    All other components within normal limits  BRAIN NATRIURETIC PEPTIDE - Abnormal; Notable for the following components:   B Natriuretic Peptide 499.5 (*)    All other components within normal limits  CULTURE, BLOOD (ROUTINE X 2)  CULTURE, BLOOD (ROUTINE X 2)  LACTIC ACID, PLASMA  LIPOPROTEIN A (LPA)  CBC  CREATININE, SERUM  VITAMIN B12  FOLATE  IRON AND TIBC  FERRITIN  RETICULOCYTES  TROPONIN I (HIGH SENSITIVITY)  TROPONIN I (HIGH SENSITIVITY)     EKG  EKG read and interpreted by me showed normal sinus rhythm rate of 79 left axis patient's QRS is 118 he is slightly short of the ability to call a left  bundle branch block.  Patient has ST elevation in V3 to 3 and 4.  Previous EKG from 2022 shared ST elevation in leads II of those leads.  Patient also has what appears to be on first glance ST elevation in leads II and III but on further evaluation this looks more like it may be artifact or in lead II PR segment depression.  Patient has no troponin elevation and no chest pain.   RADIOLOGY Patient head CT read by radiology reviewed and interpreted by me was negative patient's chest x-ray read by radiology reviewed and interpreted by me shows some CHF.  Patient was given Lasix for this and felt better.   PROCEDURES:  Critical Care performed:   Procedures   MEDICATIONS ORDERED IN ED: Medications  metoprolol tartrate (LOPRESSOR) tablet 50 mg (50 mg Oral Given 04/15/22 2203)  doxycycline (VIBRA-TABS) tablet 100 mg (100 mg Oral Given 04/15/22 2203)  pantoprazole (PROTONIX) EC tablet 40 mg (has no administration in time range)  melatonin tablet 2.5 mg (2.5 mg Oral Given 04/15/22 2205)  aspirin EC tablet 81 mg (has no administration in time range)  nitroGLYCERIN (NITROSTAT) SL tablet 0.4 mg (has no administration in time range)  acetaminophen (TYLENOL) tablet 650 mg (has no administration in time range)  ondansetron (ZOFRAN) injection 4 mg (has no administration in time range)  enoxaparin (LOVENOX) injection 40 mg (40 mg Subcutaneous Given 04/15/22 2203)  furosemide (LASIX) injection 20 mg (20 mg Intravenous Given 04/15/22 1745)  aspirin chewable tablet 324 mg (324 mg Oral Given 04/15/22 2204)    Or  aspirin suppository 300 mg ( Rectal See Alternative 04/15/22 2204)     IMPRESSION / MDM / ASSESSMENT AND PLAN / ED COURSE  I reviewed the triage vital signs and the nursing notes. I had planned on sending the patient home with her daughter and her daughter's husband and then letting them follow-up tomorrow in the heart failure clinic but the patient does not want to do that she would rather stay here.   She is very nervous about getting up to go to the bathroom.  I told her we could get her a rolling walker as she has at [facility] but she still wanted to stay here.  In view of the patient's frailty and multiple medical problems I did not  try to fight with her any further and got the hospitalist to admit her here for observation overnight.  Differential diagnosis includes, but is not limited to, congestive heart failure, heart disease especially atherosclerotic.  This is unlikely because the patient is not having chest pain her troponins remain negative.  However it is a possibility.  Patient's not confused at present and making perfectly good sense but she was confused earlier especially when she was moved out of her regular room at [facility] to a different room.  Not sure exactly why that happened possibly just the new surroundings confused her enough she is 88 years old.  Patient's presentation is most consistent with acute presentation with potential threat to life or bodily function.  {The patient is on the cardiac monitor to evaluate for evidence of arrhythmia and/or significant heart rate changes.  None were seen  Clinical Course as of 04/16/22 0007  Thu Apr 15, 2022  1722 B Natriuretic Peptide(!): 499.5 [CC]    Clinical Course User Index [CC] Leisa Lenz, [place], [place]     FINAL CLINICAL IMPRESSION(S) / ED DIAGNOSES   Final diagnoses:  Weakness  Acute congestive heart failure, unspecified heart failure type ([place])  Confusion     Rx / DC Orders   ED Discharge Orders     None        Note:  This document was prepared using Dragon voice recognition software and may include unintentional dictation errors.   Nena Polio, MD 04/16/22 [phone]

## 2022-04-15 NOTE — ED Notes (Signed)
Pt cleaned of urine at this time, new gown and linen placed on pt. Purwick placed on pt at this time.

## 2022-04-15 NOTE — Assessment & Plan Note (Signed)
Hemoglobin of 9, down from 11 nine months prior Anemia panel Continue to trend

## 2022-04-15 NOTE — Assessment & Plan Note (Signed)
No evidence of injury from recent fall No evidence of acute infection on work-up CT head no acute Neurologic checks overnight Physical therapy evaluation Anticipate discharge in 1 day

## 2022-04-15 NOTE — Assessment & Plan Note (Addendum)
Suspect noncardiac chest pain Presented with 2-week history of chest pain, generalized malaise and confusion and is currently chest pain-free.  Patient currently on doxycycline for recent ' lung infection' Troponin negative but BNP elevated to 499 and chest x-ray shows trace bilateral pleural effusions EKG shows findings that were present on EKG in 2022 We will get echocardiogram to evaluate for focal wall motion abnormality Continue outpatient doxycycline to completion

## 2022-04-15 NOTE — ED Triage Notes (Signed)
First Nurse Note:  Pt via EMS from home. Pt c/o chest pain for the past 4 days, unable to describe the pain. Denies pain at this time. Pt is currently on abx for UTI. Pt's doctor said that her EKG was abnormal. States she is also dizzy and had 1 episode of vomiting yesterday. Pt is A&Ox3 and NAD   74 HR  144/103

## 2022-04-15 NOTE — Assessment & Plan Note (Addendum)
Generalized weakness Patient has a history of a fall in December 2022 that resulted in a subdural bleed Unclear etiology to weakness and falls.  Possible recent lung infection.  Low suspicion for ACS.  Possible symptomatic anemia No evidence of injury from recent fall CT head no acute Neurologic checks overnight Physical therapy evaluation Anticipate discharge in 1 day

## 2022-04-16 ENCOUNTER — Observation Stay (HOSPITAL_BASED_OUTPATIENT_CLINIC_OR_DEPARTMENT_OTHER)
Admit: 2022-04-16 | Discharge: 2022-04-16 | Disposition: A | Discharge status: Home / Self Care | Payer: Medicare Other | Attending: Internal Medicine | Admitting: Internal Medicine

## 2022-04-16 DIAGNOSIS — R079 Chest pain, unspecified: Secondary | ICD-10-CM | POA: Diagnosis not present

## 2022-04-16 DIAGNOSIS — R0789 Other chest pain: Secondary | ICD-10-CM | POA: Diagnosis not present

## 2022-04-16 DIAGNOSIS — R410 Disorientation, unspecified: Secondary | ICD-10-CM

## 2022-04-16 DIAGNOSIS — R531 Weakness: Secondary | ICD-10-CM

## 2022-04-16 LAB — ECHOCARDIOGRAM COMPLETE
AR max vel: 1.99 cm2
AV Area VTI: 2.03 cm2
AV Area mean vel: 1.92 cm2
AV Mean grad: 7 mmHg
AV Peak grad: 13.1 mmHg
Ao pk vel: 1.81 m/s
Area-P 1/2: 3.77 cm2
Height: 60 in
P 1/2 time: 559 msec
S' Lateral: 2.8 cm
Weight: 2848 oz

## 2022-04-16 LAB — IRON AND TIBC
Iron: 19 ug/dL — ABNORMAL LOW (ref 28–170)
Saturation Ratios: 5 % — ABNORMAL LOW (ref 10.4–31.8)
TIBC: 371 ug/dL (ref 250–450)
UIBC: 352 ug/dL

## 2022-04-16 LAB — CBC
HCT: 25.9 % — ABNORMAL LOW (ref 36.0–46.0)
Hemoglobin: 8 g/dL — ABNORMAL LOW (ref 12.0–15.0)
MCH: 24 pg — ABNORMAL LOW (ref 26.0–34.0)
MCHC: 30.9 g/dL (ref 30.0–36.0)
MCV: 77.8 fL — ABNORMAL LOW (ref 80.0–100.0)
Platelets: 370 10*3/uL (ref 150–400)
RBC: 3.33 MIL/uL — ABNORMAL LOW (ref 3.87–5.11)
RDW: 18.7 % — ABNORMAL HIGH (ref 11.5–15.5)
WBC: 11 10*3/uL — ABNORMAL HIGH (ref 4.0–10.5)
nRBC: 0 % (ref 0.0–0.2)

## 2022-04-16 LAB — FERRITIN: Ferritin: 21 ng/mL (ref 11–307)

## 2022-04-16 LAB — RETICULOCYTES
Immature Retic Fract: 25.5 % — ABNORMAL HIGH (ref 2.3–15.9)
RBC.: 3.41 MIL/uL — ABNORMAL LOW (ref 3.87–5.11)
Retic Count, Absolute: 98.2 10*3/uL (ref 19.0–186.0)
Retic Ct Pct: 2.9 % (ref 0.4–3.1)

## 2022-04-16 LAB — CREATININE, SERUM
Creatinine, Ser: 0.65 mg/dL (ref 0.44–1.00)
GFR, Estimated: >60 mL/min (ref >60)

## 2022-04-16 LAB — FOLATE: Folate: 21 ng/mL (ref >5.9)

## 2022-04-16 LAB — VITAMIN B12: Vitamin B-12: 827 pg/mL (ref 180–914)

## 2022-04-16 MED ORDER — DOCUSATE SODIUM 100 MG PO CAPS
200.0000 mg | ORAL_CAPSULE | Freq: Two times a day (BID) | ORAL | Status: DC
  Administered 2022-04-16 – 2022-04-21 (×9): 200 mg via ORAL
  Filled 2022-04-16 (×10): qty 2

## 2022-04-16 MED ORDER — BISACODYL 5 MG PO TBEC: 5.0000 mg | DELAYED_RELEASE_TABLET | Freq: Every day | ORAL | Status: DC | PRN

## 2022-04-16 MED ORDER — FERROUS GLUCONATE 324 (38 FE) MG PO TABS
324.0000 mg | ORAL_TABLET | Freq: Two times a day (BID) | ORAL | Status: DC
  Administered 2022-04-16 – 2022-04-21 (×11): 324 mg via ORAL
  Filled 2022-04-16 (×11): qty 1

## 2022-04-16 NOTE — ED Notes (Signed)
Pt hard stick, RN attempts to obtain labs due @2042 . Unsuccessful attempt. Lab called for phlebotomist assistance

## 2022-04-16 NOTE — Progress Notes (Signed)
PROGRESS NOTE    Mallory Oconnor  BPZ:[id number] DOB: 1932-07-06 DOA: 04/15/2022 PCP: Melonie Florida, FNP   Assessment & Plan:   Principal Problem:   AMS (altered mental status) Active Problems:   Chest pain   Essential hypertension   Falls   Anemia  Assessment and Plan: Encephalopathy: etiology unclear, metabolic vs toxic. CT head shows no acute intracranial findings. Blood cxs NGTD. Urine cx ordered  Chest pain: likely non-cardiac. Troponin neg x 2. Echo ordered  IDA: will start iron supplements  Fall & generalized weakness: PT/OT consulted. Hx of subdural bleed in Dec 2022.  HTN: continue on metoprolol   Leukocytosis: likely reactive. Will continue to monitor    DVT prophylaxis: lovenox Code Status: DNR Family Communication: discussed pt's care w/ pt's family at bedside and answered their questions  Disposition Plan: depends on PT/OT recs  Level of care: Telemetry Cardiac  Status is: Observation The patient remains OBS appropriate and will d/c before 2 midnights.    Consultants:    Procedures:   Antimicrobials: doxycycline   Subjective: Pt c/o fatigue. Pt is very hard of hearing   Objective: Vitals:   04/15/22 2100 04/15/22 2155 04/16/22 0114 04/16/22 0529  BP: 129/60 127/71 (!) 119/51 (!) 134/53  Pulse: 81 80 70 73  Resp: 19 20 18 18   Temp:  98.7 F (37.1 C) 98.1 F (36.7 C) 98 F (36.7 C)  TempSrc:  Oral Oral Oral  SpO2: 96% 100% 95% 94%  Weight:      Height:       No intake or output data in the 24 hours ending 04/16/22 0837 Filed Weights   04/15/22 1207  Weight: 80.7 kg    Examination:  General exam: Appears calm and comfortable. Very hard of hearing  Respiratory system: clear breath sounds b/l  Cardiovascular system: S1 & S2 +. No  rubs, gallops or clicks.  Gastrointestinal system: Abdomen is nondistended, soft and nontender.  Normal bowel sounds heard. Central nervous system: Alert and awake. Moves all extremities  Psychiatry:  Judgement and insight appears poor. Flat mood and affect.     Data Reviewed: I have personally reviewed following labs and imaging studies  CBC: Recent Labs  Lab 04/15/22 1305 04/16/22 0050  WBC 8.5 11.0*  NEUTROABS 5.5  --   HGB 9.0* 8.0*  HCT 30.2* 25.9*  MCV 78.4* 77.8*  PLT 398 370   Basic Metabolic Panel: Recent Labs  Lab 04/15/22 1305 04/16/22 0050  NA 132*  --   K 4.1  --   CL 94*  --   CO2 29  --   GLUCOSE 147*  --   BUN 16  --   CREATININE 0.52 0.65  CALCIUM 9.6  --    GFR: Estimated Creatinine Clearance: 45.7 mL/min (by C-G formula based on SCr of 0.65 mg/dL). Liver Function Tests: Recent Labs  Lab 04/15/22 1305  AST 21  ALT 15  ALKPHOS 77  BILITOT 0.5  PROT 9.2*  ALBUMIN 2.8*   No results for input(s): "LIPASE", "AMYLASE" in the last 168 hours. No results for input(s): "AMMONIA" in the last 168 hours. Coagulation Profile: Recent Labs  Lab 04/15/22 1305  INR 1.5*   Cardiac Enzymes: No results for input(s): "CKTOTAL", "CKMB", "CKMBINDEX", "TROPONINI" in the last 168 hours. BNP (last 3 results) No results for input(s): "PROBNP" in the last 8760 hours. HbA1C: No results for input(s): "HGBA1C" in the last 72 hours. CBG: No results for input(s): "GLUCAP" in the last 168 hours.  Lipid Profile: No results for input(s): "CHOL", "HDL", "LDLCALC", "TRIG", "CHOLHDL", "LDLDIRECT" in the last 72 hours. Thyroid Function Tests: No results for input(s): "TSH", "T4TOTAL", "FREET4", "T3FREE", "THYROIDAB" in the last 72 hours. Anemia Panel: Recent Labs    04/16/22 0050  FOLATE 21.0  FERRITIN 21  TIBC 371  IRON 19*  RETICCTPCT 2.9   Sepsis Labs: Recent Labs  Lab 04/15/22 1305 04/15/22 1618  LATICACIDVEN 2.0* 1.3    Recent Results (from the past 240 hour(s))  Culture, blood (Routine x 2)     Status: None (Preliminary result)   Collection Time: 04/15/22  1:07 PM   Specimen: BLOOD  Result Value Ref Range Status   Specimen Description BLOOD  BLOOD RIGHT HAND  Final   Special Requests   Final    BOTTLES DRAWN AEROBIC AND ANAEROBIC Blood Culture adequate volume   Culture   Final    NO GROWTH < 24 HOURS Performed at [facility], [address], [place], [place] [address]    Report Status PENDING  Incomplete  Culture, blood (Routine x 2)     Status: None (Preliminary result)   Collection Time: 04/15/22  1:16 PM   Specimen: BLOOD  Result Value Ref Range Status   Specimen Description BLOOD BLOOD LEFT HAND  Final   Special Requests   Final    BOTTLES DRAWN AEROBIC AND ANAEROBIC Blood Culture results may not be optimal due to an inadequate volume of blood received in culture bottles   Culture   Final    NO GROWTH < 24 HOURS Performed at [facility], [address], [place], [place] [address]    Report Status PENDING  Incomplete         Radiology Studies: CT Head Wo Contrast  Result Date: 04/15/2022 CLINICAL DATA:  Altered level of consciousness, chest pain for 4 days, dizziness EXAM: CT HEAD WITHOUT CONTRAST TECHNIQUE: Contiguous axial images were obtained from the base of the skull through the vertex without intravenous contrast. RADIATION DOSE REDUCTION: This exam was performed according to the departmental dose-optimization program which includes automated exposure control, adjustment of the mA and/or kV according to patient size and/or use of iterative reconstruction technique. COMPARISON:  08/21/2021 FINDINGS: Brain: No acute infarct or hemorrhage. Lateral ventricles and midline structures are unremarkable. No acute extra-axial fluid collections. No mass effect. Vascular: No hyperdense vessel or unexpected calcification. Skull: Normal. Negative for fracture or focal lesion. Sinuses/Orbits: Mucosal thickening left maxillary sinus and left ethmoid air cells. Other: None. IMPRESSION: 1. No acute intracranial process. Electronically Signed   By: Sharlet Salina M.D.   On: 04/15/2022 16:36   DG Chest 2  View  Result Date: 04/15/2022 CLINICAL DATA:  Chest pain, suspected sepsis, on antibiotics for UTI EXAM: CHEST - 2 VIEW COMPARISON:  04/15/2022 chest radiograph. FINDINGS: Stable cardiomediastinal silhouette with normal heart size. No pneumothorax. Trace bilateral pleural effusions with minimal bibasilar atelectasis. No pulmonary edema. IMPRESSION: Trace bilateral pleural effusions with minimal bibasilar atelectasis. Electronically Signed   By: Delbert Phenix M.D.   On: 04/15/2022 12:55        Scheduled Meds:  aspirin EC  81 mg Oral Daily   doxycycline  100 mg Oral BID   enoxaparin (LOVENOX) injection  40 mg Subcutaneous Q24H   melatonin  2.5 mg Oral QHS   metoprolol tartrate  50 mg Oral BID   pantoprazole  40 mg Oral Daily   Continuous Infusions:   LOS: 0 days    Time spent:  35 mins     Wyvonnia Dusky, MD [facility] Pager [phone]  If 7PM-7AM, please contact night-coverage [email or web address] 04/16/2022, 8:37 AM

## 2022-04-16 NOTE — ED Provider Notes (Incomplete)
[facility] Provider Note    Event Date/Time   First MD Initiated Contact with Patient 04/15/22 1538     (approximate)   History   Fatigue and Altered Mental Status (Pt. To ED from independent living facility for EKG changes, AMS, frequent falls, and generalized malaise. [name] states PCP called her this AM and states she had EKG changes from 2-3 days ago. Pt. Denies pain but states she feels exhausted. Pt. Was diagnosed with "lung infection" recently.)   HPI  Mallory Oconnor is a 88 y.o. female who comes here with her daughter from independent living at [facility].  Reportedly her EKG at [facility] looked different than previously her mental status has been decreasing with more confusion than usual.  This started when she had to move out of her regular room from bedbugs in the facility and then when she moved back to her room she is still more confused than usual which has been falling more than usual and she is kind of not feeling well.  She has been getting worse over the past approximately 2 weeks per her daughter.      Physical Exam   Triage Vital Signs: ED Triage Vitals  Enc Vitals Group     BP 04/15/22 1206 (!) 135/58     Pulse Rate 04/15/22 1206 73     Resp 04/15/22 1206 20     Temp 04/15/22 1206 98.1 F (36.7 C)     Temp Source 04/15/22 1206 Oral     SpO2 04/15/22 1206 100 %     Weight 04/15/22 1207 178 lb (80.7 kg)     Height 04/15/22 1207 5' (1.524 m)     Head Circumference --      Peak Flow --      Pain Score 04/15/22 1207 0     Pain Loc --      Pain Edu? --      Excl. in GC? --     Most recent vital signs: Vitals:   04/15/22 2100 04/15/22 2155  BP: 129/60 127/71  Pulse: 81 80  Resp: 19 20  Temp:  98.7 F (37.1 C)  SpO2: 96% 100%     General: Awake, alert no distress.  Head normocephalic atraumatic CV:  Good peripheral perfusion.  Heart regular rate and rhythm no audible murmurs Resp:  Normal effort.  Lungs clear  with possibly a couple crackles in the right lower lobe base Abd:  No distention.  Soft and nontender Extremities with trace edema   ED Results / Procedures / Treatments   Labs (all labs ordered are listed, but only abnormal results are displayed) Labs Reviewed  COMPREHENSIVE METABOLIC PANEL - Abnormal; Notable for the following components:      Result Value   Sodium 132 (*)    Chloride 94 (*)    Glucose, Bld 147 (*)    Total Protein 9.2 (*)    Albumin 2.8 (*)    All other components within normal limits  LACTIC ACID, PLASMA - Abnormal; Notable for the following components:   Lactic Acid, Venous 2.0 (*)    All other components within normal limits  CBC WITH DIFFERENTIAL/PLATELET - Abnormal; Notable for the following components:   RBC 3.85 (*)    Hemoglobin 9.0 (*)    HCT 30.2 (*)    MCV 78.4 (*)    MCH 23.4 (*)    MCHC 29.8 (*)    RDW 18.6 (*)  Monocytes Absolute 1.4 (*)    All other components within normal limits  PROTIME-INR - Abnormal; Notable for the following components:   Prothrombin Time 17.5 (*)    INR 1.5 (*)    All other components within normal limits  URINALYSIS, ROUTINE W REFLEX MICROSCOPIC - Abnormal; Notable for the following components:   Color, Urine YELLOW (*)    APPearance CLEAR (*)    Protein, ur 30 (*)    Leukocytes,Ua TRACE (*)    All other components within normal limits  BRAIN NATRIURETIC PEPTIDE - Abnormal; Notable for the following components:   B Natriuretic Peptide 499.5 (*)    All other components within normal limits  CULTURE, BLOOD (ROUTINE X 2)  CULTURE, BLOOD (ROUTINE X 2)  LACTIC ACID, PLASMA  LIPOPROTEIN A (LPA)  CBC  CREATININE, SERUM  VITAMIN B12  FOLATE  IRON AND TIBC  FERRITIN  RETICULOCYTES  TROPONIN I (HIGH SENSITIVITY)  TROPONIN I (HIGH SENSITIVITY)     EKG  ***   RADIOLOGY Patient head CT read by radiology reviewed and interpreted by me was negative patient's chest x-ray read by radiology reviewed and  interpreted by me shows some CHF.  Patient was given Lasix for this and felt better.   PROCEDURES:  Critical Care performed: {CriticalCareYesNo:19197::"Yes, see critical care procedure note(s)","No"}  Procedures   MEDICATIONS ORDERED IN ED: Medications  metoprolol tartrate (LOPRESSOR) tablet 50 mg (50 mg Oral Given 04/15/22 2203)  doxycycline (VIBRA-TABS) tablet 100 mg (100 mg Oral Given 04/15/22 2203)  pantoprazole (PROTONIX) EC tablet 40 mg (has no administration in time range)  melatonin tablet 2.5 mg (2.5 mg Oral Given 04/15/22 2205)  aspirin EC tablet 81 mg (has no administration in time range)  nitroGLYCERIN (NITROSTAT) SL tablet 0.4 mg (has no administration in time range)  acetaminophen (TYLENOL) tablet 650 mg (has no administration in time range)  ondansetron (ZOFRAN) injection 4 mg (has no administration in time range)  enoxaparin (LOVENOX) injection 40 mg (40 mg Subcutaneous Given 04/15/22 2203)  furosemide (LASIX) injection 20 mg (20 mg Intravenous Given 04/15/22 1745)  aspirin chewable tablet 324 mg (324 mg Oral Given 04/15/22 2204)    Or  aspirin suppository 300 mg ( Rectal See Alternative 04/15/22 2204)     IMPRESSION / MDM / [place] / ED COURSE  I reviewed the triage vital signs and the nursing notes.                              Differential diagnosis includes, but is not limited to, ***  Patient's presentation is most consistent with {EM COPA:27473}  {If the patient is on the monitor, remove the brackets and asterisks on the sentence below and remember to document it as a Procedure as well. Otherwise delete the sentence below:1} {**The patient is on the cardiac monitor to evaluate for evidence of arrhythmia and/or significant heart rate changes.**} {Remember to include, when applicable, any/all of the following data: independent review of imaging independent review of labs (comment specifically on pertinent positives and negatives) review of specific  prior hospitalizations, PCP/specialist notes, etc. discuss meds given and prescribed document any discussion with consultants (including hospitalists) any clinical decision tools you used and why (PECARN, NEXUS, etc.) did you consider admitting the patient? document social determinants of health affecting patient's care (homelessness, inability to follow up in a timely fashion, etc) document any pre-existing conditions increasing risk on current visit (e.g. diabetes and HTN  increasing danger of high-risk chest pain/ACS) describes what meds you gave (especially parenteral) and why any other interventions?:1} Clinical Course as of 04/16/22 0001  Thu Apr 15, 2022  1722 B Natriuretic Peptide(!): 499.5 [CC]    Clinical Course User Index [CC] Chrisandra Carota, [place], [place]     FINAL CLINICAL IMPRESSION(S) / ED DIAGNOSES   Final diagnoses:  None     Rx / DC Orders   ED Discharge Orders     None        Note:  This document was prepared using Dragon voice recognition software and may include unintentional dictation errors.

## 2022-04-16 NOTE — Progress Notes (Signed)
*  PRELIMINARY RESULTS* Echocardiogram 2D Echocardiogram has been performed.  Sherrie Sport 04/16/2022, 1:15 PM

## 2022-04-17 ENCOUNTER — Encounter: Payer: Self-pay | Admitting: Internal Medicine

## 2022-04-17 DIAGNOSIS — I10 Essential (primary) hypertension: Secondary | ICD-10-CM

## 2022-04-17 DIAGNOSIS — Z823 Family history of stroke: Secondary | ICD-10-CM | POA: Diagnosis not present

## 2022-04-17 DIAGNOSIS — R531 Weakness: Secondary | ICD-10-CM | POA: Diagnosis not present

## 2022-04-17 DIAGNOSIS — Z6826 Body mass index (BMI) 26.0-26.9, adult: Secondary | ICD-10-CM | POA: Diagnosis not present

## 2022-04-17 DIAGNOSIS — D75839 Thrombocytosis, unspecified: Secondary | ICD-10-CM | POA: Diagnosis present

## 2022-04-17 DIAGNOSIS — I509 Heart failure, unspecified: Secondary | ICD-10-CM

## 2022-04-17 DIAGNOSIS — I447 Left bundle-branch block, unspecified: Secondary | ICD-10-CM | POA: Diagnosis present

## 2022-04-17 DIAGNOSIS — D509 Iron deficiency anemia, unspecified: Secondary | ICD-10-CM

## 2022-04-17 DIAGNOSIS — Z9181 History of falling: Secondary | ICD-10-CM | POA: Diagnosis not present

## 2022-04-17 DIAGNOSIS — E663 Overweight: Secondary | ICD-10-CM | POA: Diagnosis present

## 2022-04-17 DIAGNOSIS — R410 Disorientation, unspecified: Secondary | ICD-10-CM | POA: Diagnosis not present

## 2022-04-17 DIAGNOSIS — R072 Precordial pain: Secondary | ICD-10-CM | POA: Diagnosis not present

## 2022-04-17 DIAGNOSIS — Z87891 Personal history of nicotine dependence: Secondary | ICD-10-CM | POA: Diagnosis not present

## 2022-04-17 DIAGNOSIS — R296 Repeated falls: Secondary | ICD-10-CM | POA: Diagnosis present

## 2022-04-17 DIAGNOSIS — J189 Pneumonia, unspecified organism: Secondary | ICD-10-CM | POA: Diagnosis present

## 2022-04-17 DIAGNOSIS — I951 Orthostatic hypotension: Secondary | ICD-10-CM | POA: Diagnosis not present

## 2022-04-17 DIAGNOSIS — I340 Nonrheumatic mitral (valve) insufficiency: Secondary | ICD-10-CM

## 2022-04-17 DIAGNOSIS — R42 Dizziness and giddiness: Secondary | ICD-10-CM | POA: Diagnosis present

## 2022-04-17 DIAGNOSIS — H9190 Unspecified hearing loss, unspecified ear: Secondary | ICD-10-CM | POA: Diagnosis present

## 2022-04-17 DIAGNOSIS — G9341 Metabolic encephalopathy: Secondary | ICD-10-CM | POA: Diagnosis present

## 2022-04-17 DIAGNOSIS — I351 Nonrheumatic aortic (valve) insufficiency: Secondary | ICD-10-CM

## 2022-04-17 DIAGNOSIS — Z79899 Other long term (current) drug therapy: Secondary | ICD-10-CM | POA: Diagnosis not present

## 2022-04-17 DIAGNOSIS — I080 Rheumatic disorders of both mitral and aortic valves: Secondary | ICD-10-CM | POA: Diagnosis present

## 2022-04-17 DIAGNOSIS — Z66 Do not resuscitate: Secondary | ICD-10-CM | POA: Diagnosis present

## 2022-04-17 DIAGNOSIS — I5031 Acute diastolic (congestive) heart failure: Secondary | ICD-10-CM

## 2022-04-17 DIAGNOSIS — S065X0S Traumatic subdural hemorrhage without loss of consciousness, sequela: Secondary | ICD-10-CM | POA: Diagnosis not present

## 2022-04-17 DIAGNOSIS — Z5919 Other inadequate housing: Secondary | ICD-10-CM | POA: Diagnosis not present

## 2022-04-17 DIAGNOSIS — K219 Gastro-esophageal reflux disease without esophagitis: Secondary | ICD-10-CM | POA: Diagnosis present

## 2022-04-17 DIAGNOSIS — E871 Hypo-osmolality and hyponatremia: Secondary | ICD-10-CM | POA: Diagnosis present

## 2022-04-17 DIAGNOSIS — I110 Hypertensive heart disease with heart failure: Secondary | ICD-10-CM | POA: Diagnosis present

## 2022-04-17 LAB — URINE CULTURE: Culture: NO GROWTH

## 2022-04-17 LAB — BASIC METABOLIC PANEL
Anion gap: 7 (ref 5–15)
BUN: 20 mg/dL (ref 8–23)
CO2: 29 mmol/L (ref 22–32)
Calcium: 9.2 mg/dL (ref 8.9–10.3)
Chloride: 97 mmol/L — ABNORMAL LOW (ref 98–111)
Creatinine, Ser: 0.45 mg/dL (ref 0.44–1.00)
GFR, Estimated: >60 mL/min (ref >60)
Glucose, Bld: 131 mg/dL — ABNORMAL HIGH (ref 70–99)
Potassium: 3.8 mmol/L (ref 3.5–5.1)
Sodium: 133 mmol/L — ABNORMAL LOW (ref 135–145)

## 2022-04-17 LAB — CBC
HCT: 26.4 % — ABNORMAL LOW (ref 36.0–46.0)
Hemoglobin: 8 g/dL — ABNORMAL LOW (ref 12.0–15.0)
MCH: 23.3 pg — ABNORMAL LOW (ref 26.0–34.0)
MCHC: 30.3 g/dL (ref 30.0–36.0)
MCV: 76.7 fL — ABNORMAL LOW (ref 80.0–100.0)
Platelets: 410 10*3/uL — ABNORMAL HIGH (ref 150–400)
RBC: 3.44 MIL/uL — ABNORMAL LOW (ref 3.87–5.11)
RDW: 18.6 % — ABNORMAL HIGH (ref 11.5–15.5)
WBC: 11 10*3/uL — ABNORMAL HIGH (ref 4.0–10.5)
nRBC: 0 % (ref 0.0–0.2)

## 2022-04-17 LAB — LIPOPROTEIN A (LPA): Lipoprotein (a): 237.4 nmol/L — ABNORMAL HIGH (ref <75.0)

## 2022-04-17 MED ORDER — FUROSEMIDE 10 MG/ML IJ SOLN
40.0000 mg | Freq: Once | INTRAMUSCULAR | Status: AC
  Administered 2022-04-17: 40 mg via INTRAVENOUS
  Filled 2022-04-17: qty 4

## 2022-04-17 MED ORDER — FUROSEMIDE 40 MG PO TABS
40.0000 mg | ORAL_TABLET | Freq: Every day | ORAL | Status: DC
  Administered 2022-04-18: 40 mg via ORAL
  Filled 2022-04-17: qty 1

## 2022-04-17 MED ORDER — LACTULOSE 10 GM/15ML PO SOLN
20.0000 g | Freq: Two times a day (BID) | ORAL | Status: DC
  Administered 2022-04-17 – 2022-04-20 (×7): 20 g via ORAL
  Filled 2022-04-17 (×9): qty 30

## 2022-04-17 NOTE — Consult Note (Addendum)
Cardiology Consultation   Patient ID: Mallory Oconnor MRN: [id number]; DOB: May 17, 1932  Admit date: 04/15/2022 Date of Consult: 04/17/2022  PCP:  Melonie [place], FNP   [facility] Providers Cardiologist: [place] rounding Click here to update MD or APP on Care Team, Refresh:1}     Patient Profile:   Mallory Oconnor is a 88 y.o. female with a hx of hypertension who is being seen 04/17/2022 for the evaluation of HFpEF at the request of Dr. Mayford Knife.  History of Present Illness:   Mallory Oconnor is an 88 year old female with history of hypertension, hard of hearing, left bundle branch block who presents to the hospital with altered mental status.  Patient lives in an assisted living facility, his area had bedbugs infestation, moved to another area.  Became agitated, anxious and altered.  She has been dealing with shortness of breath over the past week as per daughter.  Also endorsed some leg edema.  Was evaluated by physician at the facility where she lives, thought she may have a pneumonia, started on doxycycline.  Her altered mental status did not improve, family was advised to call EMS and patient brought to the hospital.  She endorses having chest pain as outpatient, started on Protonix with resolution of symptoms.  Denies any history of heart attacks.  In the ED upon admission, EKG showed LVH, possible old septal infarct, left bundle branch block, troponins were normal x2.  Hemoglobin was 8, echo from yesterday showed EF 60 to 65%, diastolic dysfunction grade 2.  Chest x-ray showed trace bilateral effusions.  Given IV Lasix with significant improvement in shortness of breath symptoms.  Altered mental status also noted to be much improved today compared to admission.   Past Medical History:  Diagnosis Date   Hypertension     No past surgical history on file.   Home Medications:  Prior to Admission medications   Medication Sig Start Date End Date Taking?  Authorizing Provider  doxycycline (VIBRA-TABS) 100 MG tablet Take 100 mg by mouth 2 (two) times daily. 04/12/22 04/19/22 Yes [provider]  hydrochlorothiazide (HYDRODIURIL) 25 MG tablet Take 25 mg by mouth daily.   Yes [provider]  melatonin 3 MG TABS tablet Take 3 mg by mouth at bedtime.   Yes [provider]  metoprolol tartrate (LOPRESSOR) 50 MG tablet Take 50 mg by mouth 2 (two) times daily.   Yes [provider]  pantoprazole (PROTONIX) 40 MG tablet Take 40 mg by mouth daily.   Yes [provider]    Inpatient Medications: Scheduled Meds:  docusate sodium  200 mg Oral BID   doxycycline  100 mg Oral BID   enoxaparin (LOVENOX) injection  40 mg Subcutaneous Q24H   ferrous gluconate  324 mg Oral BID WC   [START ON 04/18/2022] furosemide  40 mg Oral Daily   lactulose  20 g Oral BID   melatonin  2.5 mg Oral QHS   metoprolol tartrate  50 mg Oral BID   pantoprazole  40 mg Oral Daily   Continuous Infusions:  PRN Meds: acetaminophen, bisacodyl, nitroGLYCERIN, ondansetron (ZOFRAN) IV  Allergies:   No Known Allergies  Social History:   Social History   Socioeconomic History   Marital status: Married    Spouse name: Not on file   Number of children: Not on file   Years of education: Not on file   Highest education level: Not on file  Occupational History   Not on file  Tobacco Use   Smoking status: Former    Types: Cigarettes   Smokeless tobacco: Never  Substance and Sexual Activity   Alcohol use: Not Currently   Drug use: Never   Sexual activity: Not Currently  Other Topics Concern   Not on file  Social History Narrative   ** Merged History Encounter **       Social Determinants of Health   Financial Resource Strain: Not on file  Food Insecurity: No Food Insecurity (04/16/2022)   Hunger Vital Sign    Worried About Running Out of Food in the Last Year: Never true    Ran Out of Food in the Last Year: Never true   Transportation Needs: No Transportation Needs (04/16/2022)   PRAPARE - Hydrologist (Medical): No    Lack of Transportation (Non-Medical): No  Physical Activity: Not on file  Stress: Not on file  Social Connections: Not on file  Intimate Partner Violence: Unknown (04/16/2022)   Humiliation, Afraid, Rape, and Kick questionnaire    Fear of Current or Ex-Partner: Patient refused    Emotionally Abused: Patient refused    Physically Abused: Patient refused    Sexually Abused: Patient refused    Family History:    Family History  Problem Relation Age of Onset   Stroke Maternal Grandmother    Stroke Paternal Grandmother      ROS:  Please see the history of present illness.   All other ROS reviewed and negative.     Physical Exam/Data:   Vitals:   04/16/22 2233 04/17/22 0355 04/17/22 0751 04/17/22 1213  BP: (!) 140/54 (!) 148/67 (!) 150/48 (!) 147/59  Pulse: 75 84 75 79  Resp: 17 20 18 19   Temp: 98.7 F (37.1 C) 98.1 F (36.7 C) 98.2 F (36.8 C) 98.3 F (36.8 C)  TempSrc: Oral Oral  Oral  SpO2: 96% 99% 98% 98%  Weight:      Height:        Intake/Output Summary (Last 24 hours) at 04/17/2022 1242 Last data filed at 04/17/2022 1215 Gross per 24 hour  Intake 240 ml  Output 2050 ml  Net -1810 ml      04/15/2022   12:07 PM 06/29/2021    9:33 PM  Last 3 Weights  Weight (lbs) 178 lb 165 lb  Weight (kg) 80.74 kg 74.844 kg     Body mass index is 34.76 kg/m.  General:  Well nourished, well developed, in no acute distress HEENT: normal, hard of hearing Neck: no JVD Vascular: No carotid bruits; Distal pulses 2+ bilaterally Cardiac:  normal S1, S2; RRR;  Lungs: Diminished breath sounds at bases, no wheezing Abd: soft, nontender, no hepatomegaly  Ext: Trace edema Musculoskeletal:  No deformities, BUE and BLE strength normal and equal Skin: warm and dry  Neuro:  CNs 2-12 intact, no focal abnormalities noted Psych:  Normal affect   EKG:  The  EKG was personally reviewed and demonstrates: Sinus bradycardia, old septal infarct, left bundle branch block Telemetry:  Telemetry was personally reviewed and demonstrates: Sinus rhythm, heart rate 70  Relevant CV Studies:  TTE 04/16/2022  1. Left ventricular ejection fraction, by estimation, is 60 to 65%. The  left ventricle has normal function. The left ventricle has no regional  wall motion abnormalities. There is mild left ventricular hypertrophy.  Left ventricular diastolic parameters  are consistent with Grade I diastolic dysfunction (impaired relaxation).   2. Right ventricular systolic function is normal. The right ventricular  size is normal. There is mildly elevated pulmonary artery systolic  pressure. The estimated right ventricular systolic pressure is 44.2 mmHg.   3. The mitral valve is normal in structure. Moderate mitral valve  regurgitation. No evidence of mitral stenosis.   4. The aortic valve is normal in structure. Aortic valve regurgitation is  mild to moderate. Aortic valve sclerosis is present, with no evidence of  aortic valve stenosis.   5. The inferior vena cava is normal in size with greater than 50%  respiratory variability, suggesting right atrial pressure of 3 mmHg.   Laboratory Data:  High Sensitivity Troponin:   Recent Labs  Lab 04/15/22 1618 04/15/22 1758  TROPONINIHS 11 12     Chemistry Recent Labs  Lab 04/15/22 1305 04/16/22 0050 04/17/22 0658  NA 132*  --  133*  K 4.1  --  3.8  CL 94*  --  97*  CO2 29  --  29  GLUCOSE 147*  --  131*  BUN 16  --  20  CREATININE 0.52 0.65 0.45  CALCIUM 9.6  --  9.2  GFRNONAA >60 >60 >60  ANIONGAP 9  --  7    Recent Labs  Lab 04/15/22 1305  PROT 9.2*  ALBUMIN 2.8*  AST 21  ALT 15  ALKPHOS 77  BILITOT 0.5   Lipids No results for input(s): "CHOL", "TRIG", "HDL", "LABVLDL", "LDLCALC", "CHOLHDL" in the last 168 hours.  Hematology Recent Labs  Lab 04/15/22 1305 04/16/22 0050 04/17/22 0658   WBC 8.5 11.0* 11.0*  RBC 3.85* 3.33*  3.41* 3.44*  HGB 9.0* 8.0* 8.0*  HCT 30.2* 25.9* 26.4*  MCV 78.4* 77.8* 76.7*  MCH 23.4* 24.0* 23.3*  MCHC 29.8* 30.9 30.3  RDW 18.6* 18.7* 18.6*  PLT 398 370 410*   Thyroid No results for input(s): "TSH", "FREET4" in the last 168 hours.  BNP Recent Labs  Lab 04/15/22 1618  BNP 499.5*    DDimer No results for input(s): "DDIMER" in the last 168 hours.   Radiology/Studies:  ECHOCARDIOGRAM COMPLETE  Result Date: 04/16/2022    ECHOCARDIOGRAM REPORT   Patient Name:   [place]isha S [place] Date of Exam: 04/16/2022 Medical Rec #:  [id number]        Height:       60.0 in Accession #:    [id number]       Weight:       178.0 lb Date of Birth:  Nov 09, 1931        BSA:          1.776 m Patient Age:    89 years         BP:           136/50 mmHg Patient Gender: F                HR:           61 bpm. Exam Location:  [facility] Procedure: 2D Echo, Cardiac Doppler and Color Doppler Indications:     Chest pain R07.9  History:         Patient has no prior history of Echocardiogram examinations.                  Risk Factors:Hypertension.  Sonographer:     Cristela BlueJerry Hege Referring Phys:  [id number] Andris BaumannHAZEL V DUNCAN Diagnosing Phys: Julien Nordmannimothy Gollan MD  Sonographer Comments: Image quality was fair. IMPRESSIONS  1. Left ventricular ejection fraction, by estimation, is 60 to 65%. The left ventricle has normal function.  The left ventricle has no regional wall motion abnormalities. There is mild left ventricular hypertrophy. Left ventricular diastolic parameters are consistent with Grade I diastolic dysfunction (impaired relaxation).  2. Right ventricular systolic function is normal. The right ventricular size is normal. There is mildly elevated pulmonary artery systolic pressure. The estimated right ventricular systolic pressure is 44.2 mmHg.  3. The mitral valve is normal in structure. Moderate mitral valve regurgitation. No evidence of mitral stenosis.  4. The aortic valve is normal in structure.  Aortic valve regurgitation is mild to moderate. Aortic valve sclerosis is present, with no evidence of aortic valve stenosis.  5. The inferior vena cava is normal in size with greater than 50% respiratory variability, suggesting right atrial pressure of 3 mmHg. FINDINGS  Left Ventricle: Left ventricular ejection fraction, by estimation, is 60 to 65%. The left ventricle has normal function. The left ventricle has no regional wall motion abnormalities. The left ventricular internal cavity size was normal in size. There is  mild left ventricular hypertrophy. Left ventricular diastolic parameters are consistent with Grade I diastolic dysfunction (impaired relaxation). Right Ventricle: The right ventricular size is normal. No increase in right ventricular wall thickness. Right ventricular systolic function is normal. There is mildly elevated pulmonary artery systolic pressure. The tricuspid regurgitant velocity is 3.13  m/s, and with an assumed right atrial pressure of 5 mmHg, the estimated right ventricular systolic pressure is 44.2 mmHg. Left Atrium: Left atrial size was normal in size. Right Atrium: Right atrial size was normal in size. Pericardium: There is no evidence of pericardial effusion. Mitral Valve: The mitral valve is normal in structure. Moderate mitral valve regurgitation. No evidence of mitral valve stenosis. Tricuspid Valve: The tricuspid valve is normal in structure. Tricuspid valve regurgitation is not demonstrated. No evidence of tricuspid stenosis. Aortic Valve: The aortic valve is normal in structure. Aortic valve regurgitation is mild to moderate. Aortic regurgitation PHT measures 559 msec. Aortic valve sclerosis is present, with no evidence of aortic valve stenosis. Aortic valve mean gradient measures 7.0 mmHg. Aortic valve peak gradient measures 13.1 mmHg. Aortic valve area, by VTI measures 2.03 cm. Pulmonic Valve: The pulmonic valve was normal in structure. Pulmonic valve regurgitation is not  visualized. No evidence of pulmonic stenosis. Aorta: The aortic root is normal in size and structure. Venous: The inferior vena cava is normal in size with greater than 50% respiratory variability, suggesting right atrial pressure of 3 mmHg. IAS/Shunts: The interatrial septum appears to be lipomatous. No atrial level shunt detected by color flow Doppler.  LEFT VENTRICLE PLAX 2D LVIDd:         4.20 cm   Diastology LVIDs:         2.80 cm   LV e' medial:    6.42 cm/s LV PW:         1.00 cm   LV E/e' medial:  14.9 LV IVS:        0.90 cm   LV e' lateral:   7.18 cm/s LVOT diam:     1.90 cm   LV E/e' lateral: 13.3 LV SV:         84 LV SV Index:   47 LVOT Area:     2.84 cm  RIGHT VENTRICLE RV Basal diam:  2.60 cm RV Mid diam:    2.10 cm RV S prime:     15.70 cm/s TAPSE (M-mode): 2.6 cm LEFT ATRIUM             Index  RIGHT ATRIUM           Index LA diam:        3.00 cm 1.69 cm/m   RA Area:     11.60 cm LA Vol (A2C):   36.5 ml 20.55 ml/m  RA Volume:   22.10 ml  12.44 ml/m LA Vol (A4C):   18.1 ml 10.19 ml/m LA Biplane Vol: 27.7 ml 15.59 ml/m  AORTIC VALVE AV Area (Vmax):    1.99 cm AV Area (Vmean):   1.92 cm AV Area (VTI):     2.03 cm AV Vmax:           181.00 cm/s AV Vmean:          124.000 cm/s AV VTI:            0.414 m AV Peak Grad:      13.1 mmHg AV Mean Grad:      7.0 mmHg LVOT Vmax:         127.00 cm/s LVOT Vmean:        83.800 cm/s LVOT VTI:          0.296 m LVOT/AV VTI ratio: 0.71 AI PHT:            559 msec  AORTA Ao Root diam: 2.60 cm MITRAL VALVE                TRICUSPID VALVE MV Area (PHT): 3.77 cm     TR Peak grad:   39.2 mmHg MV Decel Time: 201 msec     TR Vmax:        313.00 cm/s MV E velocity: 95.40 cm/s MV A velocity: 108.00 cm/s  SHUNTS MV E/A ratio:  0.88         Systemic VTI:  0.30 m                             Systemic Diam: 1.90 cm Julien Nordmann MD Electronically signed by Julien Nordmann MD Signature Date/Time: 04/16/2022/2:16:55 PM    Final    CT Head Wo Contrast  Result Date:  04/15/2022 CLINICAL DATA:  Altered level of consciousness, chest pain for 4 days, dizziness EXAM: CT HEAD WITHOUT CONTRAST TECHNIQUE: Contiguous axial images were obtained from the base of the skull through the vertex without intravenous contrast. RADIATION DOSE REDUCTION: This exam was performed according to the departmental dose-optimization program which includes automated exposure control, adjustment of the mA and/or kV according to patient size and/or use of iterative reconstruction technique. COMPARISON:  08/21/2021 FINDINGS: Brain: No acute infarct or hemorrhage. Lateral ventricles and midline structures are unremarkable. No acute extra-axial fluid collections. No mass effect. Vascular: No hyperdense vessel or unexpected calcification. Skull: Normal. Negative for fracture or focal lesion. Sinuses/Orbits: Mucosal thickening left maxillary sinus and left ethmoid air cells. Other: None. IMPRESSION: 1. No acute intracranial process. Electronically Signed   By: Sharlet Salina M.D.   On: 04/15/2022 16:36   DG Chest 2 View  Result Date: 04/15/2022 CLINICAL DATA:  Chest pain, suspected sepsis, on antibiotics for UTI EXAM: CHEST - 2 VIEW COMPARISON:  04/15/2022 chest radiograph. FINDINGS: Stable cardiomediastinal silhouette with normal heart size. No pneumothorax. Trace bilateral pleural effusions with minimal bibasilar atelectasis. No pulmonary edema. IMPRESSION: Trace bilateral pleural effusions with minimal bibasilar atelectasis. Electronically Signed   By: Delbert Phenix M.D.   On: 04/15/2022 12:55     Assessment and Plan:   HFpEF, grade 2 diastolic dysfunction -  Symptoms improved with IV Lasix -Start p.o. Lasix 40 mg daily, hold HCTZ -If BP stays elevated, consider addition of Aldactone. -No additional testing or ischemic work-up planned.  2.  Chest pain, likely GERD -Symptoms resolved with Protonix, continue PPI -Troponins normal -No ischemic/invasive work-up planned.  3.   Hypertension -Lopressor, Lasix.  4.  Mild to moderate mitral and aortic valve regurgitation -Serial monitoring with echocardiograms as outpatient  5.  Microcytic anemia, low iron levels -Work-up as per primary team -Will stop aspirin  6.  Altered mental status, possible pneumonia -Management as per medicine team  Total encounter time more than 85 minutes  Greater than 50% was spent in counseling and coordination of care with the patient   Signed, Debbe Odea, MD  04/17/2022 12:42 PM

## 2022-04-17 NOTE — Evaluation (Signed)
Physical Therapy Evaluation Patient Details Name: Mallory Oconnor MRN: [id number] DOB: Jul 11, 1932 Today's Date: 04/17/2022  History of Present Illness  Mallory Oconnor is an 89yoF who comes to [facility] on 04/15/22 c fatigue and AMS, multiple falls at home. Pt admitted with medtabolic encephalopathy. DTR reports pt was dizzy a couple days prior after walk to dining room, decided to return to room.  Clinical Impression  Pt in bed awake on arrival awaiting meal tray, agreeable to session. Pt very HOH, hearing aids not working, Chartered loss adjuster uses a transcription app to facilitate communication. Bed mobility and transfers performed at baseline level of independence per pt, some clear weakness and effort noted. After sitting in recliner, pt c/o dizziness, BP much lower than 8a vitals taken 3 hours earlier (~50mmHg lower). Pt fully reclined for supine BP, mild dizziness improvement, mild improvement in BP, but unable to obtain a standing BP over 2 attempts. Pt becomes increasingly dizzy and asks to sit due to presyncope. DTR in room for much of session. Pt left up in recliner, resting, called to have breakfast sent up again, earlier tray is MIA.      Recommendations for follow up therapy are one component of a multi-disciplinary discharge planning process, led by the attending physician.  Recommendations may be updated based on patient status, additional functional criteria and insurance authorization.  Follow Up Recommendations Home health PT      Assistance Recommended at Discharge Intermittent Supervision/Assistance  Patient can return home with the following  A little help with walking and/or transfers;A little help with bathing/dressing/bathroom    Equipment Recommendations None recommended by PT  Recommendations for Other Services       Functional Status Assessment Patient has had a recent decline in their functional status and demonstrates the ability to make significant improvements in function in a  reasonable and predictable amount of time.     Precautions / Restrictions Precautions Precautions: Fall Restrictions Weight Bearing Restrictions: No      Mobility  Bed Mobility Overal bed mobility: Modified Independent                  Transfers Overall transfer level: Needs assistance Equipment used: Rolling walker (2 wheels) Transfers: Sit to/from Stand Sit to Stand: Supervision           General transfer comment: heavy effort, high velocity technique    Ambulation/Gait Ambulation/Gait assistance:  (deferred due to progressive dizziness, orthostatic presyncope after 2 minutes standng)                Stairs            Wheelchair Mobility    Modified Rankin (Stroke Patients Only)       Balance                                             Pertinent Vitals/Pain      Home Living Family/patient expects to be discharged to:: Private residence Living Arrangements: Alone Available Help at Discharge: Family (DTR lives nearby, talks daily, visits 1x/week) Type of Home: Independent living facility Home Access: Level entry       Home Layout: One level Home Equipment: Rollator (4 wheels);Toilet riser;Cane - single point      Prior Function Prior Level of Function : Independent/Modified Independent             Mobility Comments: Ambulates  with rollator at baseline within facility. Facility provides cooking and meals ADLs Comments: had had more difficulty with ADL and fatigue.     Hand Dominance        Extremity/Trunk Assessment                Communication      Cognition                                                General Comments      Exercises     Assessment/Plan    PT Assessment Patient needs continued PT services  PT Problem List Decreased strength;Decreased range of motion;Decreased activity tolerance;Decreased balance;Decreased mobility;Decreased knowledge of use of  DME;Decreased safety awareness;Decreased knowledge of precautions       PT Treatment Interventions DME instruction;Balance training;Gait training;Stair training;Functional mobility training;Therapeutic activities;Therapeutic exercise;Patient/family education    PT Goals (Current goals can be found in the Care Plan section)  Acute Rehab PT Goals Patient Stated Goal: not be dizzy when up PT Goal Formulation: With patient Time For Goal Achievement: 05/01/22 Potential to Achieve Goals: Good    Frequency Min 2X/week     Co-evaluation               AM-PAC PT "6 Clicks" Mobility  Outcome Measure Help needed turning from your back to your side while in a flat bed without using bedrails?: None Help needed moving from lying on your back to sitting on the side of a flat bed without using bedrails?: None Help needed moving to and from a bed to a chair (including a wheelchair)?: A Little Help needed standing up from a chair using your arms (e.g., wheelchair or bedside chair)?: A Little Help needed to walk in hospital room?: A Lot Help needed climbing 3-5 steps with a railing? : A Lot 6 Click Score: 18    End of Session   Activity Tolerance: Patient tolerated treatment well;No increased pain;Treatment limited secondary to medical complications (Comment) Patient left: in chair;with family/visitor present;with call bell/phone within reach Nurse Communication: Mobility status PT Visit Diagnosis: Unsteadiness on feet (R26.81);Other abnormalities of gait and mobility (R26.89)    Time: 2376-2831 PT Time Calculation (min) (ACUTE ONLY): 47 min   Charges:   PT Evaluation $PT Eval Low Complexity: 1 Low PT Treatments $Therapeutic Activity: 8-22 mins       11:32 AM, 04/17/22 Etta Grandchild, PT, DPT Physical Therapist - [facility]  [phone] ([place])    Karlton Maya C 04/17/2022, 11:27 AM

## 2022-04-17 NOTE — TOC Transition Note (Signed)
Transition of Care [facility]) - CM/SW Discharge Note   Patient Details  Name: Mallory Oconnor MRN: [id number] Date of Birth: [birth year]  [facility]) CM/SW Contact:  Harriet Masson, RN Phone Number:[phone] 04/17/2022, 3:33 PM   Clinical Narrative:    Recommend HHPT/OT and DME. Spoke with pt's daughter Vaughan Basta concerning preference and [place] arrangements. Receptive to [facility]) accepted and [facility]) concerning DME. Daughter states pt has support system and uses her [place] for ambulation however would need a wheelchair for mobility. Daughter requested the [facility] be delivered to the pt's home. Pt has sufficient transportation services upon discharge and to all medica appointments. No other needs presented at this time.   TOC will continue to assist as needed.   Final next level of care: [place] Barriers to Discharge: Continued Medical Work up   Patient Goals and CMS Choice        Discharge Placement                       Discharge Plan and Services                DME Arranged: Wheelchair manual DME Agency: [facility] Date DME Agency Contacted: 04/17/22 Time DME Agency Contacted: 9   HH Arranged: PT, OT          Social Determinants of Health (SDOH) Interventions     Readmission Risk Interventions     No data to display

## 2022-04-17 NOTE — Progress Notes (Signed)
PROGRESS NOTE    Mallory Oconnor  WUJ:[id number] DOB: July 16, 1931 DOA: 04/15/2022 PCP: Melonie FloridaGregory, Traci, FNP   Assessment & Plan:   Principal Problem:   AMS (altered mental status) Active Problems:   Chest pain   Essential hypertension   Falls   Anemia  Assessment and Plan: Encephalopathy: etiology unclear, likely metabolic. Improved.  CT head shows no acute intracranial findings. Blood cxs NGTD. Urine cx shows no growth.   Acute diastolic CHF exacerbation: s/p IV lasix x1. Continue on metoprolol. Cardio consulted   Chest pain: likely non-cardiac. No chest pain today. Troponin neg x 2. Echo shows EF 60-65%, grade I diastolic dysfunction, MR & AR. Cardio consulted   Orthostatic hypotension: when working w/ PT 04/17/22.  IDA: continue on iron supplements   Fall & generalized weakness: PT recs HH. HH orders placed. Hx of subdural bleed in Dec 2022.  HTN: continue on metoprolol  Hyponatremia: trending up slightly today   Leukocytosis: likely reactive. Will continue to monitor   Thrombocytosis: etiology unclear, likely reactive. Will continue to monitor    DVT prophylaxis: lovenox Code Status: DNR Family Communication: discussed pt's care w/ pt's family at bedside and answered their questions  Disposition Plan: likely d/c home w/ HH   Level of care: Telemetry Cardiac  Status is: Inpatient Remains inpatient appropriate because: severity of illness      Consultants:    Procedures:   Antimicrobials: doxycycline   Subjective: Pt c/o dizziness w/ standing   Objective: Vitals:   04/16/22 1924 04/16/22 2233 04/17/22 0355 04/17/22 0751  BP: (!) 151/63 (!) 140/54 (!) 148/67 (!) 150/48  Pulse: 79 75 84 75  Resp: 18 17 20 18   Temp: 98.3 F (36.8 C) 98.7 F (37.1 C) 98.1 F (36.7 C) 98.2 F (36.8 C)  TempSrc:  Oral Oral   SpO2: 100% 96% 99% 98%  Weight:      Height:        Intake/Output Summary (Last 24 hours) at 04/17/2022 0814 Last data filed at 04/17/2022  0355 Gross per 24 hour  Intake --  Output 1450 ml  Net -1450 ml   Filed Weights   04/15/22 1207  Weight: 80.7 kg    Examination:  General exam: Appears uncomfortable. Hard of hearing  Respiratory system: decreased breath sounds b/l Cardiovascular system: S1/S2+. No rubs or gallops Gastrointestinal system: Abd is soft, NT, obese & hypoactive bowel sounds  Central nervous system: Alert and awake. Moves all extremities  Psychiatry: Judgement and insight appears poor. Flat mood and affect    Data Reviewed: I have personally reviewed following labs and imaging studies  CBC: Recent Labs  Lab 04/15/22 1305 04/16/22 0050 04/17/22 0658  WBC 8.5 11.0* 11.0*  NEUTROABS 5.5  --   --   HGB 9.0* 8.0* 8.0*  HCT 30.2* 25.9* 26.4*  MCV 78.4* 77.8* 76.7*  PLT 398 370 410*   Basic Metabolic Panel: Recent Labs  Lab 04/15/22 1305 04/16/22 0050 04/17/22 0658  NA 132*  --  133*  K 4.1  --  3.8  CL 94*  --  97*  CO2 29  --  29  GLUCOSE 147*  --  131*  BUN 16  --  20  CREATININE 0.52 0.65 0.45  CALCIUM 9.6  --  9.2   GFR: Estimated Creatinine Clearance: 45.7 mL/min (by C-G formula based on SCr of 0.45 mg/dL). Liver Function Tests: Recent Labs  Lab 04/15/22 1305  AST 21  ALT 15  ALKPHOS 77  BILITOT  0.5  PROT 9.2*  ALBUMIN 2.8*   No results for input(s): "LIPASE", "AMYLASE" in the last 168 hours. No results for input(s): "AMMONIA" in the last 168 hours. Coagulation Profile: Recent Labs  Lab 04/15/22 1305  INR 1.5*   Cardiac Enzymes: No results for input(s): "CKTOTAL", "CKMB", "CKMBINDEX", "TROPONINI" in the last 168 hours. BNP (last 3 results) No results for input(s): "PROBNP" in the last 8760 hours. HbA1C: No results for input(s): "HGBA1C" in the last 72 hours. CBG: No results for input(s): "GLUCAP" in the last 168 hours. Lipid Profile: No results for input(s): "CHOL", "HDL", "LDLCALC", "TRIG", "CHOLHDL", "LDLDIRECT" in the last 72 hours. Thyroid Function  Tests: No results for input(s): "TSH", "T4TOTAL", "FREET4", "T3FREE", "THYROIDAB" in the last 72 hours. Anemia Panel: Recent Labs    04/16/22 0050  VITAMINB12 827  FOLATE 21.0  FERRITIN 21  TIBC 371  IRON 19*  RETICCTPCT 2.9   Sepsis Labs: Recent Labs  Lab 04/15/22 1305 04/15/22 1618  LATICACIDVEN 2.0* 1.3    Recent Results (from the past 240 hour(s))  Culture, blood (Routine x 2)     Status: None (Preliminary result)   Collection Time: 04/15/22  1:07 PM   Specimen: BLOOD  Result Value Ref Range Status   Specimen Description BLOOD BLOOD RIGHT HAND  Final   Special Requests   Final    BOTTLES DRAWN AEROBIC AND ANAEROBIC Blood Culture adequate volume   Culture   Final    NO GROWTH 2 DAYS Performed at [facility], [address] [place], [place] [address]    Report Status PENDING  Incomplete  Culture, blood (Routine x 2)     Status: None (Preliminary result)   Collection Time: 04/15/22  1:16 PM   Specimen: BLOOD  Result Value Ref Range Status   Specimen Description BLOOD BLOOD LEFT HAND  Final   Special Requests   Final    BOTTLES DRAWN AEROBIC AND ANAEROBIC Blood Culture results may not be optimal due to an inadequate volume of blood received in culture bottles   Culture   Final    NO GROWTH 2 DAYS Performed at [facility], [address], [place], [place] [address]    Report Status PENDING  Incomplete         Radiology Studies: ECHOCARDIOGRAM COMPLETE  Result Date: 04/16/2022    ECHOCARDIOGRAM REPORT   Patient Name:   Mallory Oconnor Date of Exam: 04/16/2022 Medical Rec #:  [id number]        Height:       60.0 in Accession #:    [id number]       Weight:       178.0 lb Date of Birth:  18-Sep-1931        BSA:          1.776 m Patient Age:    88 years         BP:           136/50 mmHg Patient Gender: F                HR:           61 bpm. Exam Location:  [facility] Procedure: 2D Echo, Cardiac Doppler and Color Doppler Indications:     Chest pain R07.9   History:         Patient has no prior history of Echocardiogram examinations.                  Risk  Factors:Hypertension.  Sonographer:     Cristela Blue Referring Phys:  [id number] Andris Baumann Diagnosing Phys: Julien Nordmann MD  Sonographer Comments: Image quality was fair. IMPRESSIONS  1. Left ventricular ejection fraction, by estimation, is 60 to 65%. The left ventricle has normal function. The left ventricle has no regional wall motion abnormalities. There is mild left ventricular hypertrophy. Left ventricular diastolic parameters are consistent with Grade I diastolic dysfunction (impaired relaxation).  2. Right ventricular systolic function is normal. The right ventricular size is normal. There is mildly elevated pulmonary artery systolic pressure. The estimated right ventricular systolic pressure is 44.2 mmHg.  3. The mitral valve is normal in structure. Moderate mitral valve regurgitation. No evidence of mitral stenosis.  4. The aortic valve is normal in structure. Aortic valve regurgitation is mild to moderate. Aortic valve sclerosis is present, with no evidence of aortic valve stenosis.  5. The inferior vena cava is normal in size with greater than 50% respiratory variability, suggesting right atrial pressure of 3 mmHg. FINDINGS  Left Ventricle: Left ventricular ejection fraction, by estimation, is 60 to 65%. The left ventricle has normal function. The left ventricle has no regional wall motion abnormalities. The left ventricular internal cavity size was normal in size. There is  mild left ventricular hypertrophy. Left ventricular diastolic parameters are consistent with Grade I diastolic dysfunction (impaired relaxation). Right Ventricle: The right ventricular size is normal. No increase in right ventricular wall thickness. Right ventricular systolic function is normal. There is mildly elevated pulmonary artery systolic pressure. The tricuspid regurgitant velocity is 3.13  m/s, and with an assumed right  atrial pressure of 5 mmHg, the estimated right ventricular systolic pressure is 44.2 mmHg. Left Atrium: Left atrial size was normal in size. Right Atrium: Right atrial size was normal in size. Pericardium: There is no evidence of pericardial effusion. Mitral Valve: The mitral valve is normal in structure. Moderate mitral valve regurgitation. No evidence of mitral valve stenosis. Tricuspid Valve: The tricuspid valve is normal in structure. Tricuspid valve regurgitation is not demonstrated. No evidence of tricuspid stenosis. Aortic Valve: The aortic valve is normal in structure. Aortic valve regurgitation is mild to moderate. Aortic regurgitation PHT measures 559 msec. Aortic valve sclerosis is present, with no evidence of aortic valve stenosis. Aortic valve mean gradient measures 7.0 mmHg. Aortic valve peak gradient measures 13.1 mmHg. Aortic valve area, by VTI measures 2.03 cm. Pulmonic Valve: The pulmonic valve was normal in structure. Pulmonic valve regurgitation is not visualized. No evidence of pulmonic stenosis. Aorta: The aortic root is normal in size and structure. Venous: The inferior vena cava is normal in size with greater than 50% respiratory variability, suggesting right atrial pressure of 3 mmHg. IAS/Shunts: The interatrial septum appears to be lipomatous. No atrial level shunt detected by color flow Doppler.  LEFT VENTRICLE PLAX 2D LVIDd:         4.20 cm   Diastology LVIDs:         2.80 cm   LV e' medial:    6.42 cm/s LV PW:         1.00 cm   LV E/e' medial:  14.9 LV IVS:        0.90 cm   LV e' lateral:   7.18 cm/s LVOT diam:     1.90 cm   LV E/e' lateral: 13.3 LV SV:         84 LV SV Index:   47 LVOT Area:     2.84 cm  RIGHT VENTRICLE RV Basal diam:  2.60 cm RV Mid diam:    2.10 cm RV S prime:     15.70 cm/s TAPSE (M-mode): 2.6 cm LEFT ATRIUM             Index        RIGHT ATRIUM           Index LA diam:        3.00 cm 1.69 cm/m   RA Area:     11.60 cm LA Vol (A2C):   36.5 ml 20.55 ml/m  RA  Volume:   22.10 ml  12.44 ml/m LA Vol (A4C):   18.1 ml 10.19 ml/m LA Biplane Vol: 27.7 ml 15.59 ml/m  AORTIC VALVE AV Area (Vmax):    1.99 cm AV Area (Vmean):   1.92 cm AV Area (VTI):     2.03 cm AV Vmax:           181.00 cm/s AV Vmean:          124.000 cm/s AV VTI:            0.414 m AV Peak Grad:      13.1 mmHg AV Mean Grad:      7.0 mmHg LVOT Vmax:         127.00 cm/s LVOT Vmean:        83.800 cm/s LVOT VTI:          0.296 m LVOT/AV VTI ratio: 0.71 AI PHT:            559 msec  AORTA Ao Root diam: 2.60 cm MITRAL VALVE                TRICUSPID VALVE MV Area (PHT): 3.77 cm     TR Peak grad:   39.2 mmHg MV Decel Time: 201 msec     TR Vmax:        313.00 cm/s MV E velocity: 95.40 cm/s MV A velocity: 108.00 cm/s  SHUNTS MV E/A ratio:  0.88         Systemic VTI:  0.30 m                             Systemic Diam: 1.90 cm Julien Nordmann MD Electronically signed by Julien Nordmann MD Signature Date/Time: 04/16/2022/2:16:55 PM    Final    CT Head Wo Contrast  Result Date: 04/15/2022 CLINICAL DATA:  Altered level of consciousness, chest pain for 4 days, dizziness EXAM: CT HEAD WITHOUT CONTRAST TECHNIQUE: Contiguous axial images were obtained from the base of the skull through the vertex without intravenous contrast. RADIATION DOSE REDUCTION: This exam was performed according to the departmental dose-optimization program which includes automated exposure control, adjustment of the mA and/or kV according to patient size and/or use of iterative reconstruction technique. COMPARISON:  08/21/2021 FINDINGS: Brain: No acute infarct or hemorrhage. Lateral ventricles and midline structures are unremarkable. No acute extra-axial fluid collections. No mass effect. Vascular: No hyperdense vessel or unexpected calcification. Skull: Normal. Negative for fracture or focal lesion. Sinuses/Orbits: Mucosal thickening left maxillary sinus and left ethmoid air cells. Other: None. IMPRESSION: 1. No acute intracranial process.  Electronically Signed   By: Sharlet Salina M.D.   On: 04/15/2022 16:36   DG Chest 2 View  Result Date: 04/15/2022 CLINICAL DATA:  Chest pain, suspected sepsis, on antibiotics for UTI EXAM: CHEST - 2 VIEW COMPARISON:  04/15/2022 chest radiograph. FINDINGS: Stable cardiomediastinal silhouette with normal  heart size. No pneumothorax. Trace bilateral pleural effusions with minimal bibasilar atelectasis. No pulmonary edema. IMPRESSION: Trace bilateral pleural effusions with minimal bibasilar atelectasis. Electronically Signed   By: Ilona Sorrel M.D.   On: 04/15/2022 12:55        Scheduled Meds:  aspirin EC  81 mg Oral Daily   docusate sodium  200 mg Oral BID   doxycycline  100 mg Oral BID   enoxaparin (LOVENOX) injection  40 mg Subcutaneous Q24H   ferrous gluconate  324 mg Oral BID WC   furosemide  40 mg Intravenous Once   melatonin  2.5 mg Oral QHS   metoprolol tartrate  50 mg Oral BID   pantoprazole  40 mg Oral Daily   Continuous Infusions:   LOS: 0 days    Time spent: 36 mins     Wyvonnia Dusky, MD [facility] Pager [phone]  If 7PM-7AM, please contact night-coverage [email or web address] 04/17/2022, 8:14 AM

## 2022-04-17 NOTE — Evaluation (Signed)
Occupational Therapy Evaluation Patient Details Name: Mallory Oconnor MRN: [id number] DOB: Oct 14, 1931 Today's Date: 04/17/2022   History of Present Illness pt is an 88 year old female admitted to the ED because of an abnormal EKG after she presented with dizziness, an episode of vomiting the day prior and intermittent chest pain over the previous 4 days; PMH significant for Hypertension, falls with injury including subdural bleed in December 2022, currently on doxycycline for a lung infection   Clinical Impression   Chart reviewed, pt greeted in chair with daughter present. Pt is alert and oriented x4, extremely HOH with no available hearing aids therefore dictation app utilized on iphone. daughter present throughout. PTA pt is typically MOD I in ADL, assist for IADL from ILF, amb with rollator. Pt has required increased time/effort to complete ADLs the last few weeks. Pt presents with deficits in strength, endurance, activity tolerance, all affecting safe and optimal ADL completion. Pt is able to perform STS, short amb transfer with supervision with RW, peri care via STS with supervision with RW. Pt is limited by reported dizziness. Recommend HHOT following discharge.   Pt c/o dizziness throughout,  Laying in chair with feet up/ reclined: BP 110/62 (MAP 78) HR 78  Sitting feet down: 85/60 (MAP 68) HR 79 Standing: 103/65 (Map 78) HR 116 Pt unable to tolerate standing 3 minutes for additional reading      Recommendations for follow up therapy are one component of a multi-disciplinary discharge planning process, led by the attending physician.  Recommendations may be updated based on patient status, additional functional criteria and insurance authorization.   Follow Up Recommendations  Home health OT    Assistance Recommended at Discharge Frequent or constant Supervision/Assistance  Patient can return home with the following A little help with walking and/or transfers;A little help with  bathing/dressing/bathroom    Functional Status Assessment  Patient has had a recent decline in their functional status and demonstrates the ability to make significant improvements in function in a reasonable and predictable amount of time.  Equipment Recommendations  Wheelchair (measurements OT)    Recommendations for Other Services       Precautions / Restrictions Precautions Precautions: Fall Precaution Comments: watch orthostatics Restrictions Weight Bearing Restrictions: No      Mobility Bed Mobility Overal bed mobility: Needs Assistance Bed Mobility: Sit to Supine       Sit to supine: Modified independent (Device/Increase time)        Transfers Overall transfer level: Needs assistance Equipment used: Rolling walker (2 wheels) Transfers: Sit to/from Stand Sit to Stand: Supervision                  Balance Overall balance assessment: Needs assistance Sitting-balance support: Feet supported Sitting balance-Leahy Scale: Good     Standing balance support: Bilateral upper extremity supported, During functional activity Standing balance-Leahy Scale: Fair                             ADL either performed or assessed with clinical judgement   ADL Overall ADL's : Needs assistance/impaired Eating/Feeding: Set up;Sitting   Grooming: Wash/dry hands;Wash/dry face;Sitting;Set up               Lower Body Dressing: Moderate assistance Lower Body Dressing Details (indicate cue type and reason): educated on use of AE, reacher/sock aid, energy conservation techniques Toilet Transfer: Supervision/safety;Rolling walker (2 wheels) Toilet Transfer Details (indicate cue type and reason): simulated, short  amb transfer Toileting- Clothing Manipulation and Hygiene: Supervision/safety;Sit to/from stand Toileting - Clothing Manipulation Details (indicate cue type and reason): peri care     Functional mobility during ADLs:  (deferred due to signficant  dizziness)       Vision Patient Visual Report: No change from baseline       Perception     Praxis      Pertinent Vitals/Pain Pain Assessment Pain Assessment: No/denies pain     Hand Dominance     Extremity/Trunk Assessment Upper Extremity Assessment Upper Extremity Assessment: Generalized weakness   Lower Extremity Assessment Lower Extremity Assessment: Generalized weakness       Communication     Cognition Arousal/Alertness: Awake/alert Behavior During Therapy: WFL for tasks assessed/performed Overall Cognitive Status: Within Functional Limits for tasks assessed                                 General Comments: extremely HOH, utilized dictation app on phone, pt able to interact approrpiately     General Comments  pt c/o dizziness with position changes, especially when not lying flat, orthostatics taken, team notified    Exercises Other Exercises Other Exercises: edu pt and daughter re: role of OT, role of rehab, discharge recommendations, home safety, use of mwc, use of reacher/sock aid, energy conservation techniques   Shoulder Instructions      Home Living Family/patient expects to be discharged to:: Private residence (independent living) Living Arrangements: Alone Available Help at Discharge: Family;Available PRN/intermittently (daugther lives nearby, plans to assist, also plans to hire help) Type of Home: Independent living facility Home Access: Level entry     Home Layout: One level     Bathroom Shower/Tub: Estate manager/land agent Accessibility: Yes   Home Equipment: Rollator (4 wheels);Toilet riser;Cane - single point;Shower seat          Prior Functioning/Environment Prior Level of Function : Independent/Modified Independent;History of Falls (last six months)             Mobility Comments: amb with rollator, fall history ADLs Comments: MOD I with ADL, recently increased difficulty performing, requring incrased  time; facility assists with IADLs        OT Problem List: Decreased strength;Decreased activity tolerance;Impaired balance (sitting and/or standing);Decreased knowledge of use of DME or AE      OT Treatment/Interventions: Self-care/ADL training;Therapeutic activities;Therapeutic exercise;Energy conservation;Patient/family education    OT Goals(Current goals can be found in the care plan section) Acute Rehab OT Goals Patient Stated Goal: go back to ILF OT Goal Formulation: With patient/family Time For Goal Achievement: 05/01/22 Potential to Achieve Goals: Good ADL Goals Pt Will Perform Grooming: with modified independence;sitting;standing Pt Will Perform Lower Body Dressing: with modified independence;with adaptive equipment Pt Will Transfer to Toilet: with modified independence;ambulating Pt Will Perform Toileting - Clothing Manipulation and hygiene: with modified independence;sit to/from stand  OT Frequency: Min 2X/week    Co-evaluation              AM-PAC OT "6 Clicks" Daily Activity     Outcome Measure Help from another person eating meals?: None Help from another person taking care of personal grooming?: None Help from another person toileting, which includes using toliet, bedpan, or urinal?: A Little Help from another person bathing (including washing, rinsing, drying)?: A Little Help from another person to put on and taking off regular upper body clothing?: None Help from another person to put  on and taking off regular lower body clothing?: A Little 6 Click Score: 21   End of Session Equipment Utilized During Treatment: Rolling walker (2 wheels) Nurse Communication: Mobility status  Activity Tolerance: Patient tolerated treatment well Patient left: in bed;with call bell/phone within reach;with bed alarm set;with family/visitor present  OT Visit Diagnosis: Unsteadiness on feet (R26.81);Repeated falls (R29.6)                Time: 2423-5361 OT Time Calculation  (min): 33 min Charges:  OT General Charges $OT Visit: 1 Visit OT Evaluation $OT Eval Moderate Complexity: 1 Mod OT Treatments $Self Care/Home Management : 8-22 mins Oleta Mouse, OTD OTR/L  04/17/22, 3:12 PM

## 2022-04-17 NOTE — Progress Notes (Cosign Needed)
    Durable Medical Equipment  (From admission, onward)           Start     Ordered   04/17/22 1411  For home use only DME lightweight manual wheelchair with seat cushion  Once       Comments: Patient suffers from generalized weakness which impairs their ability to perform daily activities like bathing, dressing, feeding, grooming, and toileting in the home.  A cane or walker will not resolve  issue with performing activities of daily living. A wheelchair will allow patient to safely perform daily activities. Patient is not able to propel themselves in the home using a standard weight wheelchair due to general weakness. Patient can self propel in the lightweight wheelchair. Length of need Lifetime. Accessories: elevating leg rests (ELRs), wheel locks, extensions and anti-tippers.   04/17/22 1410

## 2022-04-18 DIAGNOSIS — I5031 Acute diastolic (congestive) heart failure: Secondary | ICD-10-CM | POA: Diagnosis not present

## 2022-04-18 DIAGNOSIS — I10 Essential (primary) hypertension: Secondary | ICD-10-CM | POA: Diagnosis not present

## 2022-04-18 DIAGNOSIS — R531 Weakness: Secondary | ICD-10-CM | POA: Diagnosis not present

## 2022-04-18 DIAGNOSIS — R072 Precordial pain: Secondary | ICD-10-CM | POA: Diagnosis not present

## 2022-04-18 DIAGNOSIS — I951 Orthostatic hypotension: Secondary | ICD-10-CM | POA: Diagnosis not present

## 2022-04-18 DIAGNOSIS — I509 Heart failure, unspecified: Secondary | ICD-10-CM | POA: Diagnosis not present

## 2022-04-18 LAB — CBC
HCT: 27.8 % — ABNORMAL LOW (ref 36.0–46.0)
Hemoglobin: 8.4 g/dL — ABNORMAL LOW (ref 12.0–15.0)
MCH: 23.3 pg — ABNORMAL LOW (ref 26.0–34.0)
MCHC: 30.2 g/dL (ref 30.0–36.0)
MCV: 77.2 fL — ABNORMAL LOW (ref 80.0–100.0)
Platelets: 408 10*3/uL — ABNORMAL HIGH (ref 150–400)
RBC: 3.6 MIL/uL — ABNORMAL LOW (ref 3.87–5.11)
RDW: 18.6 % — ABNORMAL HIGH (ref 11.5–15.5)
WBC: 10.6 10*3/uL — ABNORMAL HIGH (ref 4.0–10.5)
nRBC: 0 % (ref 0.0–0.2)

## 2022-04-18 LAB — BASIC METABOLIC PANEL
Anion gap: 10 (ref 5–15)
BUN: 26 mg/dL — ABNORMAL HIGH (ref 8–23)
CO2: 27 mmol/L (ref 22–32)
Calcium: 9.1 mg/dL (ref 8.9–10.3)
Chloride: 95 mmol/L — ABNORMAL LOW (ref 98–111)
Creatinine, Ser: 0.54 mg/dL (ref 0.44–1.00)
GFR, Estimated: >60 mL/min (ref >60)
Glucose, Bld: 136 mg/dL — ABNORMAL HIGH (ref 70–99)
Potassium: 3.5 mmol/L (ref 3.5–5.1)
Sodium: 132 mmol/L — ABNORMAL LOW (ref 135–145)

## 2022-04-18 MED ORDER — FUROSEMIDE 20 MG PO TABS
20.0000 mg | ORAL_TABLET | Freq: Every day | ORAL | Status: DC
  Administered 2022-04-19 – 2022-04-21 (×3): 20 mg via ORAL
  Filled 2022-04-18 (×3): qty 1

## 2022-04-18 MED ORDER — SPIRONOLACTONE 25 MG PO TABS
25.0000 mg | ORAL_TABLET | Freq: Every day | ORAL | Status: DC
  Administered 2022-04-18 – 2022-04-21 (×4): 25 mg via ORAL
  Filled 2022-04-18 (×4): qty 1

## 2022-04-18 MED ORDER — METOPROLOL SUCCINATE ER 25 MG PO TB24: 12.5000 mg | ORAL_TABLET | Freq: Every day | ORAL | Status: DC

## 2022-04-18 NOTE — Progress Notes (Signed)
PROGRESS NOTE    Mallory Oconnor  UUV:[id number] DOB: 11/24/1931 DOA: 04/15/2022 PCP: Melonie Florida, FNP   Assessment & Plan:   Principal Problem:   AMS (altered mental status) Active Problems:   Chest pain   Essential hypertension   Falls   Anemia   CHF (congestive heart failure) (HCC)  Assessment and Plan: Encephalopathy: etiology unclear, likely metabolic. Close to baseline CT head shows no acute intracranial findings. Blood cxs NGTD. Urine cx no growth.   Acute diastolic CHF exacerbation: continue on aldactone, reduce lasix 20mg  daily & d/c metoprolol. Cardio following and recs apprec   Chest pain: likely non-cardiac. No chest pain today. Troponin neg x 2. Echo shows EF 60-65%, grade I diastolic dysfunction, MR & AR. Cardio following and recs apprec  Orthostatic hypotension: again today. Reduce lasix 20mg  daily, continue on aldactone & d/c metoprolol   IDA: continue on iron supplements   Fall & generalized weakness: PT recs HH. Hx of subdural bleed in Dec 2022.  HTN: continue on aldactone. D/c metoprolol secondary orthostatic hypotension  Hyponatremia: labile. Will continue to monitor   Leukocytosis: trending down, likely reactive   Thrombocytosis: etiology unclear, likely reactive    DVT prophylaxis: lovenox Code Status: DNR Family Communication: discussed pt's care w/ pt's family at bedside and answered their questions  Disposition Plan: likely d/c home w/ HH   Level of care: Telemetry Cardiac  Status is: Inpatient Remains inpatient appropriate because: severity of illness, still orthostatic today       Consultants:    Procedures:   Antimicrobials: doxycycline   Subjective: Pt still c/o dizziness w/ standing   Objective: Vitals:   04/17/22 1944 04/17/22 2237 04/18/22 0331 04/18/22 0343  BP: (!) 143/69 (!) 148/59 (!) 144/45 (!) 155/55  Pulse: 79 80 78 79  Resp: 20 (!) 1 16   Temp: 98.2 F (36.8 C)  97.9 F (36.6 C)   TempSrc:   Oral    SpO2: 99% 99% 100%   Weight:    63.6 kg  Height:        Intake/Output Summary (Last 24 hours) at 04/18/2022 0819 Last data filed at 04/17/2022 1858 Gross per 24 hour  Intake 240 ml  Output 1000 ml  Net -760 ml   Filed Weights   04/15/22 1207 04/18/22 0343  Weight: 80.7 kg 63.6 kg    Examination:  General exam: Appears calm & comfortable  Respiratory system: diminished breath sounds b/l  Cardiovascular system: S1 & S2+. No rubs or gallops  Gastrointestinal system: Abd is soft, NT, ND & hypoactive bowel sounds  Central nervous system: Alert and awake. Moves all extremities  Psychiatry: Judgement and insight appears improved. Flat mood and affect    Data Reviewed: I have personally reviewed following labs and imaging studies  CBC: Recent Labs  Lab 04/15/22 1305 04/16/22 0050 04/17/22 0658 04/18/22 0453  WBC 8.5 11.0* 11.0* 10.6*  NEUTROABS 5.5  --   --   --   HGB 9.0* 8.0* 8.0* 8.4*  HCT 30.2* 25.9* 26.4* 27.8*  MCV 78.4* 77.8* 76.7* 77.2*  PLT 398 370 410* 408*   Basic Metabolic Panel: Recent Labs  Lab 04/15/22 1305 04/16/22 0050 04/17/22 0658 04/18/22 0453  NA 132*  --  133* 132*  K 4.1  --  3.8 3.5  CL 94*  --  97* 95*  CO2 29  --  29 27  GLUCOSE 147*  --  131* 136*  BUN 16  --  20 26*  CREATININE  0.52 0.65 0.45 0.54  CALCIUM 9.6  --  9.2 9.1   GFR: Estimated Creatinine Clearance: 40.4 mL/min (by C-G formula based on SCr of 0.54 mg/dL). Liver Function Tests: Recent Labs  Lab 04/15/22 1305  AST 21  ALT 15  ALKPHOS 77  BILITOT 0.5  PROT 9.2*  ALBUMIN 2.8*   No results for input(s): "LIPASE", "AMYLASE" in the last 168 hours. No results for input(s): "AMMONIA" in the last 168 hours. Coagulation Profile: Recent Labs  Lab 04/15/22 1305  INR 1.5*   Cardiac Enzymes: No results for input(s): "CKTOTAL", "CKMB", "CKMBINDEX", "TROPONINI" in the last 168 hours. BNP (last 3 results) No results for input(s): "PROBNP" in the last 8760  hours. HbA1C: No results for input(s): "HGBA1C" in the last 72 hours. CBG: No results for input(s): "GLUCAP" in the last 168 hours. Lipid Profile: No results for input(s): "CHOL", "HDL", "LDLCALC", "TRIG", "CHOLHDL", "LDLDIRECT" in the last 72 hours. Thyroid Function Tests: No results for input(s): "TSH", "T4TOTAL", "FREET4", "T3FREE", "THYROIDAB" in the last 72 hours. Anemia Panel: Recent Labs    04/16/22 0050  VITAMINB12 827  FOLATE 21.0  FERRITIN 21  TIBC 371  IRON 19*  RETICCTPCT 2.9   Sepsis Labs: Recent Labs  Lab 04/15/22 1305 04/15/22 1618  LATICACIDVEN 2.0* 1.3    Recent Results (from the past 240 hour(s))  Culture, blood (Routine x 2)     Status: None (Preliminary result)   Collection Time: 04/15/22  1:07 PM   Specimen: BLOOD  Result Value Ref Range Status   Specimen Description BLOOD BLOOD RIGHT HAND  Final   Special Requests   Final    BOTTLES DRAWN AEROBIC AND ANAEROBIC Blood Culture adequate volume   Culture   Final    NO GROWTH 3 DAYS Performed at [facility], [address], [place], [place] [address]    Report Status PENDING  Incomplete  Culture, blood (Routine x 2)     Status: None (Preliminary result)   Collection Time: 04/15/22  1:16 PM   Specimen: BLOOD  Result Value Ref Range Status   Specimen Description BLOOD BLOOD LEFT HAND  Final   Special Requests   Final    BOTTLES DRAWN AEROBIC AND ANAEROBIC Blood Culture results may not be optimal due to an inadequate volume of blood received in culture bottles   Culture   Final    NO GROWTH 3 DAYS Performed at [facility], [address], [place], [place] [address]    Report Status PENDING  Incomplete  Urine Culture     Status: None   Collection Time: 04/15/22  2:33 PM   Specimen: Urine, Clean Catch  Result Value Ref Range Status   Specimen Description   Final    URINE, CLEAN CATCH Performed at [facility], [address] [place], [place] [address]    Special  Requests   Final    NONE Performed at [facility], [address] [place], [place] [address]    Culture   Final    NO GROWTH Performed at [facility], [address], [place], [place] [address]    Report Status 04/17/2022 FINAL  Final         Radiology Studies: ECHOCARDIOGRAM COMPLETE  Result Date: 04/16/2022    ECHOCARDIOGRAM REPORT   Patient Name:   Corie S Oconnor Date of Exam: 04/16/2022 Medical Rec #:  [id number]        Height:       60.0  in Accession #:    [id number]       Weight:       178.0 lb Date of Birth:  1932-05-08        BSA:          1.776 m Patient Age:    88 years         BP:           136/50 mmHg Patient Gender: F                HR:           61 bpm. Exam Location:  [facility] Procedure: 2D Echo, Cardiac Doppler and Color Doppler Indications:     Chest pain R07.9  History:         Patient has no prior history of Echocardiogram examinations.                  Risk Factors:Hypertension.  Sonographer:     Cristela BlueJerry Hege Referring Phys:  [id number] Andris BaumannHAZEL V DUNCAN Diagnosing Phys: Julien Nordmannimothy Gollan MD  Sonographer Comments: Image quality was fair. IMPRESSIONS  1. Left ventricular ejection fraction, by estimation, is 60 to 65%. The left ventricle has normal function. The left ventricle has no regional wall motion abnormalities. There is mild left ventricular hypertrophy. Left ventricular diastolic parameters are consistent with Grade I diastolic dysfunction (impaired relaxation).  2. Right ventricular systolic function is normal. The right ventricular size is normal. There is mildly elevated pulmonary artery systolic pressure. The estimated right ventricular systolic pressure is 44.2 mmHg.  3. The mitral valve is normal in structure. Moderate mitral valve regurgitation. No evidence of mitral stenosis.  4. The aortic valve is normal in structure. Aortic valve regurgitation is mild to moderate. Aortic valve sclerosis is present, with no evidence of aortic valve stenosis.  5. The inferior  vena cava is normal in size with greater than 50% respiratory variability, suggesting right atrial pressure of 3 mmHg. FINDINGS  Left Ventricle: Left ventricular ejection fraction, by estimation, is 60 to 65%. The left ventricle has normal function. The left ventricle has no regional wall motion abnormalities. The left ventricular internal cavity size was normal in size. There is  mild left ventricular hypertrophy. Left ventricular diastolic parameters are consistent with Grade I diastolic dysfunction (impaired relaxation). Right Ventricle: The right ventricular size is normal. No increase in right ventricular wall thickness. Right ventricular systolic function is normal. There is mildly elevated pulmonary artery systolic pressure. The tricuspid regurgitant velocity is 3.13  m/s, and with an assumed right atrial pressure of 5 mmHg, the estimated right ventricular systolic pressure is 44.2 mmHg. Left Atrium: Left atrial size was normal in size. Right Atrium: Right atrial size was normal in size. Pericardium: There is no evidence of pericardial effusion. Mitral Valve: The mitral valve is normal in structure. Moderate mitral valve regurgitation. No evidence of mitral valve stenosis. Tricuspid Valve: The tricuspid valve is normal in structure. Tricuspid valve regurgitation is not demonstrated. No evidence of tricuspid stenosis. Aortic Valve: The aortic valve is normal in structure. Aortic valve regurgitation is mild to moderate. Aortic regurgitation PHT measures 559 msec. Aortic valve sclerosis is present, with no evidence of aortic valve stenosis. Aortic valve mean gradient measures 7.0 mmHg. Aortic valve peak gradient measures 13.1 mmHg. Aortic valve area, by VTI measures 2.03 cm. Pulmonic Valve: The pulmonic valve was normal in structure. Pulmonic valve regurgitation is not visualized. No evidence of pulmonic stenosis. Aorta: The aortic root is normal in  size and structure. Venous: The inferior vena cava is normal  in size with greater than 50% respiratory variability, suggesting right atrial pressure of 3 mmHg. IAS/Shunts: The interatrial septum appears to be lipomatous. No atrial level shunt detected by color flow Doppler.  LEFT VENTRICLE PLAX 2D LVIDd:         4.20 cm   Diastology LVIDs:         2.80 cm   LV e' medial:    6.42 cm/s LV PW:         1.00 cm   LV E/e' medial:  14.9 LV IVS:        0.90 cm   LV e' lateral:   7.18 cm/s LVOT diam:     1.90 cm   LV E/e' lateral: 13.3 LV SV:         84 LV SV Index:   47 LVOT Area:     2.84 cm  RIGHT VENTRICLE RV Basal diam:  2.60 cm RV Mid diam:    2.10 cm RV S prime:     15.70 cm/s TAPSE (M-mode): 2.6 cm LEFT ATRIUM             Index        RIGHT ATRIUM           Index LA diam:        3.00 cm 1.69 cm/m   RA Area:     11.60 cm LA Vol (A2C):   36.5 ml 20.55 ml/m  RA Volume:   22.10 ml  12.44 ml/m LA Vol (A4C):   18.1 ml 10.19 ml/m LA Biplane Vol: 27.7 ml 15.59 ml/m  AORTIC VALVE AV Area (Vmax):    1.99 cm AV Area (Vmean):   1.92 cm AV Area (VTI):     2.03 cm AV Vmax:           181.00 cm/s AV Vmean:          124.000 cm/s AV VTI:            0.414 m AV Peak Grad:      13.1 mmHg AV Mean Grad:      7.0 mmHg LVOT Vmax:         127.00 cm/s LVOT Vmean:        83.800 cm/s LVOT VTI:          0.296 m LVOT/AV VTI ratio: 0.71 AI PHT:            559 msec  AORTA Ao Root diam: 2.60 cm MITRAL VALVE                TRICUSPID VALVE MV Area (PHT): 3.77 cm     TR Peak grad:   39.2 mmHg MV Decel Time: 201 msec     TR Vmax:        313.00 cm/s MV E velocity: 95.40 cm/s MV A velocity: 108.00 cm/s  SHUNTS MV E/A ratio:  0.88         Systemic VTI:  0.30 m                             Systemic Diam: 1.90 cm Julien Nordmann MD Electronically signed by Julien Nordmann MD Signature Date/Time: 04/16/2022/2:16:55 PM    Final         Scheduled Meds:  docusate sodium  200 mg Oral BID   doxycycline  100 mg Oral BID   enoxaparin (LOVENOX) injection  40 mg Subcutaneous Q24H   ferrous gluconate  324 mg Oral  BID WC   furosemide  40 mg Oral Daily   lactulose  20 g Oral BID   melatonin  2.5 mg Oral QHS   metoprolol tartrate  50 mg Oral BID   pantoprazole  40 mg Oral Daily   Continuous Infusions:   LOS: 1 day    Time spent: 30 mins     Wyvonnia Dusky, MD [facility] Pager [phone]  If 7PM-7AM, please contact night-coverage [email or web address] 04/18/2022, 8:19 AM

## 2022-04-18 NOTE — Progress Notes (Addendum)
Rounding Note    Patient Name: Mallory Oconnor Date of Encounter: 04/18/2022  [facility] Cardiologist: None   Subjective   No acute events overnight, feels well, denies shortness of breath.  Gets dizzy when moving from seated to standing position.  Inpatient Medications    Scheduled Meds:  docusate sodium  200 mg Oral BID   doxycycline  100 mg Oral BID   enoxaparin (LOVENOX) injection  40 mg Subcutaneous Q24H   ferrous gluconate  324 mg Oral BID WC   furosemide  40 mg Oral Daily   lactulose  20 g Oral BID   melatonin  2.5 mg Oral QHS   [START ON 04/19/2022] metoprolol succinate  12.5 mg Oral Daily   pantoprazole  40 mg Oral Daily   spironolactone  25 mg Oral Daily   Continuous Infusions:  PRN Meds: acetaminophen, bisacodyl, nitroGLYCERIN, ondansetron (ZOFRAN) IV   Vital Signs    Vitals:   04/18/22 0331 04/18/22 0343 04/18/22 0940 04/18/22 1150  BP: (!) 144/45 (!) 155/55 (!) 158/59 (!) 144/51  Pulse: 78 79 85 71  Resp: 16  18 18   Temp: 97.9 F (36.6 C)  98 F (36.7 C) 97.6 F (36.4 C)  TempSrc: Oral  Oral   SpO2: 100%  99% 98%  Weight:  63.6 kg    Height:        Intake/Output Summary (Last 24 hours) at 04/18/2022 1256 Last data filed at 04/17/2022 1858 Gross per 24 hour  Intake --  Output 400 ml  Net -400 ml      04/18/2022    3:43 AM 04/15/2022   12:07 PM 06/29/2021    9:33 PM  Last 3 Weights  Weight (lbs) 140 lb 3.2 oz 178 lb 165 lb  Weight (kg) 63.594 kg 80.74 kg 74.844 kg      Telemetry    Sinus rhythm, heart rate 69- Personally Reviewed  ECG     - Personally Reviewed  Physical Exam   GEN: No acute distress.   Neck: No JVD Cardiac: RRR, no murmurs, rubs, or gallops.  Respiratory: Clear to auscultation bilaterally. GI: Soft, nontender, non-distended  MS: No edema; No deformity. Neuro:  Nonfocal  Psych: Normal affect   Labs    High Sensitivity Troponin:   Recent Labs  Lab 04/15/22 1618 04/15/22 1758  TROPONINIHS 11  12     Chemistry Recent Labs  Lab 04/15/22 1305 04/16/22 0050 04/17/22 0658 04/18/22 0453  NA 132*  --  133* 132*  K 4.1  --  3.8 3.5  CL 94*  --  97* 95*  CO2 29  --  29 27  GLUCOSE 147*  --  131* 136*  BUN 16  --  20 26*  CREATININE 0.52 0.65 0.45 0.54  CALCIUM 9.6  --  9.2 9.1  PROT 9.2*  --   --   --   ALBUMIN 2.8*  --   --   --   AST 21  --   --   --   ALT 15  --   --   --   ALKPHOS 77  --   --   --   BILITOT 0.5  --   --   --   GFRNONAA >60 >60 >60 >60  ANIONGAP 9  --  7 10    Lipids No results for input(s): "CHOL", "TRIG", "HDL", "LABVLDL", "LDLCALC", "CHOLHDL" in the last 168 hours.  Hematology Recent Labs  Lab 04/16/22 0050 04/17/22 [id number]  04/18/22 0453  WBC 11.0* 11.0* 10.6*  RBC 3.33*  3.41* 3.44* 3.60*  HGB 8.0* 8.0* 8.4*  HCT 25.9* 26.4* 27.8*  MCV 77.8* 76.7* 77.2*  MCH 24.0* 23.3* 23.3*  MCHC 30.9 30.3 30.2  RDW 18.7* 18.6* 18.6*  PLT 370 410* 408*   Thyroid No results for input(s): "TSH", "FREET4" in the last 168 hours.  BNP Recent Labs  Lab 04/15/22 1618  BNP 499.5*    DDimer No results for input(s): "DDIMER" in the last 168 hours.   Radiology    ECHOCARDIOGRAM COMPLETE  Result Date: 04/16/2022    ECHOCARDIOGRAM REPORT   Patient Name:   Mallory Oconnor Date of Exam: 04/16/2022 Medical Rec #:  [id number]        Height:       60.0 in Accession #:    [id number]       Weight:       178.0 lb Date of Birth:  05/27/1932        BSA:          1.776 m Patient Age:    88 years         BP:           136/50 mmHg Patient Gender: F                HR:           61 bpm. Exam Location:  [facility] Procedure: 2D Echo, Cardiac Doppler and Color Doppler Indications:     Chest pain R07.9  History:         Patient has no prior history of Echocardiogram examinations.                  Risk Factors:Hypertension.  Sonographer:     Sherrie Sport Referring Phys:  [id number] Athena Masse Diagnosing Phys: Ida Rogue MD  Sonographer Comments: Image quality was fair. IMPRESSIONS  1.  Left ventricular ejection fraction, by estimation, is 60 to 65%. The left ventricle has normal function. The left ventricle has no regional wall motion abnormalities. There is mild left ventricular hypertrophy. Left ventricular diastolic parameters are consistent with Grade I diastolic dysfunction (impaired relaxation).  2. Right ventricular systolic function is normal. The right ventricular size is normal. There is mildly elevated pulmonary artery systolic pressure. The estimated right ventricular systolic pressure is 99991111 mmHg.  3. The mitral valve is normal in structure. Moderate mitral valve regurgitation. No evidence of mitral stenosis.  4. The aortic valve is normal in structure. Aortic valve regurgitation is mild to moderate. Aortic valve sclerosis is present, with no evidence of aortic valve stenosis.  5. The inferior vena cava is normal in size with greater than 50% respiratory variability, suggesting right atrial pressure of 3 mmHg. FINDINGS  Left Ventricle: Left ventricular ejection fraction, by estimation, is 60 to 65%. The left ventricle has normal function. The left ventricle has no regional wall motion abnormalities. The left ventricular internal cavity size was normal in size. There is  mild left ventricular hypertrophy. Left ventricular diastolic parameters are consistent with Grade I diastolic dysfunction (impaired relaxation). Right Ventricle: The right ventricular size is normal. No increase in right ventricular wall thickness. Right ventricular systolic function is normal. There is mildly elevated pulmonary artery systolic pressure. The tricuspid regurgitant velocity is 3.13  m/s, and with an assumed right atrial pressure of 5 mmHg, the estimated right ventricular systolic pressure is 99991111 mmHg. Left Atrium: Left atrial size was normal in size. Right  Atrium: Right atrial size was normal in size. Pericardium: There is no evidence of pericardial effusion. Mitral Valve: The mitral valve is normal  in structure. Moderate mitral valve regurgitation. No evidence of mitral valve stenosis. Tricuspid Valve: The tricuspid valve is normal in structure. Tricuspid valve regurgitation is not demonstrated. No evidence of tricuspid stenosis. Aortic Valve: The aortic valve is normal in structure. Aortic valve regurgitation is mild to moderate. Aortic regurgitation PHT measures 559 msec. Aortic valve sclerosis is present, with no evidence of aortic valve stenosis. Aortic valve mean gradient measures 7.0 mmHg. Aortic valve peak gradient measures 13.1 mmHg. Aortic valve area, by VTI measures 2.03 cm. Pulmonic Valve: The pulmonic valve was normal in structure. Pulmonic valve regurgitation is not visualized. No evidence of pulmonic stenosis. Aorta: The aortic root is normal in size and structure. Venous: The inferior vena cava is normal in size with greater than 50% respiratory variability, suggesting right atrial pressure of 3 mmHg. IAS/Shunts: The interatrial septum appears to be lipomatous. No atrial level shunt detected by color flow Doppler.  LEFT VENTRICLE PLAX 2D LVIDd:         4.20 cm   Diastology LVIDs:         2.80 cm   LV e' medial:    6.42 cm/s LV PW:         1.00 cm   LV E/e' medial:  14.9 LV IVS:        0.90 cm   LV e' lateral:   7.18 cm/s LVOT diam:     1.90 cm   LV E/e' lateral: 13.3 LV SV:         84 LV SV Index:   47 LVOT Area:     2.84 cm  RIGHT VENTRICLE RV Basal diam:  2.60 cm RV Mid diam:    2.10 cm RV S prime:     15.70 cm/s TAPSE (M-mode): 2.6 cm LEFT ATRIUM             Index        RIGHT ATRIUM           Index LA diam:        3.00 cm 1.69 cm/m   RA Area:     11.60 cm LA Vol (A2C):   36.5 ml 20.55 ml/m  RA Volume:   22.10 ml  12.44 ml/m LA Vol (A4C):   18.1 ml 10.19 ml/m LA Biplane Vol: 27.7 ml 15.59 ml/m  AORTIC VALVE AV Area (Vmax):    1.99 cm AV Area (Vmean):   1.92 cm AV Area (VTI):     2.03 cm AV Vmax:           181.00 cm/s AV Vmean:          124.000 cm/s AV VTI:            0.414 m AV  Peak Grad:      13.1 mmHg AV Mean Grad:      7.0 mmHg LVOT Vmax:         127.00 cm/s LVOT Vmean:        83.800 cm/s LVOT VTI:          0.296 m LVOT/AV VTI ratio: 0.71 AI PHT:            559 msec  AORTA Ao Root diam: 2.60 cm MITRAL VALVE                TRICUSPID VALVE MV Area (PHT): 3.77 cm  TR Peak grad:   39.2 mmHg MV Decel Time: 201 msec     TR Vmax:        313.00 cm/s MV E velocity: 95.40 cm/s MV A velocity: 108.00 cm/s  SHUNTS MV E/A ratio:  0.88         Systemic VTI:  0.30 m                             Systemic Diam: 1.90 cm Ida Rogue MD Electronically signed by Ida Rogue MD Signature Date/Time: 04/16/2022/2:16:55 PM    Final     Cardiac Studies   TTE 04/2022  1. Left ventricular ejection fraction, by estimation, is 60 to 65%. The  left ventricle has normal function. The left ventricle has no regional  wall motion abnormalities. There is mild left ventricular hypertrophy.  Left ventricular diastolic parameters  are consistent with Grade I diastolic dysfunction (impaired relaxation).   2. Right ventricular systolic function is normal. The right ventricular  size is normal. There is mildly elevated pulmonary artery systolic  pressure. The estimated right ventricular systolic pressure is 99991111 mmHg.   3. The mitral valve is normal in structure. Moderate mitral valve  regurgitation. No evidence of mitral stenosis.   4. The aortic valve is normal in structure. Aortic valve regurgitation is  mild to moderate. Aortic valve sclerosis is present, with no evidence of  aortic valve stenosis.   5. The inferior vena cava is normal in size with greater than 50%  respiratory variability, suggesting right atrial pressure of 3 mmHg.   Patient Profile     88 y.o. female with history of hypertension, hard of hearing, left bundle branch block presenting with altered mental status and shortness of breath, being seen for HFpEF and leg edema.  Assessment & Plan    HFpEF, grade 2 diastolic  dysfunction -Symptoms resolved with Lasix -reduce Lasix to 20 mg daily, start Aldactone 25 mg daily -No additional testing or ischemic work-up planned.   2.  Chest pain, likely GERD -Symptoms resolved with Protonix, continue PPI -Troponins normal -No ischemic/invasive work-up planned.   3.  Hypertension -Due to dizziness with changing position, elderly female, okay to let BP run a little high .systolic of [id number] okay. -Reduce Lasix to 20 mg daily, continue Aldactone 25 mg -Stop metoprolol.   4.  Mild to moderate mitral and aortic valve regurgitation -Serial monitoring with echocardiograms as outpatient   5.  Microcytic anemia, low iron levels -Work-up as per primary team -Aspirin stopped   6.  Altered mental status, possible pneumonia -Improving -Management as per medicine team  Total encounter time more than 50 minutes  Greater than 50% was spent in counseling and coordination of care with the patient      Signed, Kate Sable, MD  04/18/2022, 12:56 PM

## 2022-04-19 DIAGNOSIS — I5031 Acute diastolic (congestive) heart failure: Secondary | ICD-10-CM | POA: Diagnosis not present

## 2022-04-19 DIAGNOSIS — R531 Weakness: Secondary | ICD-10-CM | POA: Diagnosis not present

## 2022-04-19 DIAGNOSIS — R072 Precordial pain: Secondary | ICD-10-CM | POA: Diagnosis not present

## 2022-04-19 DIAGNOSIS — I951 Orthostatic hypotension: Secondary | ICD-10-CM | POA: Diagnosis not present

## 2022-04-19 LAB — CBC
HCT: 27.4 % — ABNORMAL LOW (ref 36.0–46.0)
Hemoglobin: 8.4 g/dL — ABNORMAL LOW (ref 12.0–15.0)
MCH: 23.7 pg — ABNORMAL LOW (ref 26.0–34.0)
MCHC: 30.7 g/dL (ref 30.0–36.0)
MCV: 77.4 fL — ABNORMAL LOW (ref 80.0–100.0)
Platelets: 384 10*3/uL (ref 150–400)
RBC: 3.54 MIL/uL — ABNORMAL LOW (ref 3.87–5.11)
RDW: 18.4 % — ABNORMAL HIGH (ref 11.5–15.5)
WBC: 11 10*3/uL — ABNORMAL HIGH (ref 4.0–10.5)
nRBC: 0 % (ref 0.0–0.2)

## 2022-04-19 LAB — BASIC METABOLIC PANEL
Anion gap: 9 (ref 5–15)
BUN: 23 mg/dL (ref 8–23)
CO2: 28 mmol/L (ref 22–32)
Calcium: 9.3 mg/dL (ref 8.9–10.3)
Chloride: 97 mmol/L — ABNORMAL LOW (ref 98–111)
Creatinine, Ser: 0.5 mg/dL (ref 0.44–1.00)
GFR, Estimated: >60 mL/min (ref >60)
Glucose, Bld: 138 mg/dL — ABNORMAL HIGH (ref 70–99)
Potassium: 3.6 mmol/L (ref 3.5–5.1)
Sodium: 134 mmol/L — ABNORMAL LOW (ref 135–145)

## 2022-04-19 NOTE — Consult Note (Addendum)
Heart Failure Nurse Navigator Note  HFpEF 60 to 65%.  Grade 2 diastolic dysfunction.  Mild to moderate aortic insufficiency.  Mild mitral regurgitation.  She presented to the emergency room from independent living with complaints of fatigue and altered mental status along with shortness of breath,dizziness and lower extremity edema.  BNP was 499.  Chest x-ray revealed trace pleural effusions.  Comorbidities:   Hypertension Subdural bleed Left bundle branch block  Medications:  Furosemide 20 mg by mouth daily Spironolactone 25 mg daily  Home medications include metoprolol and hydrochlorothiazide.  Labs:  Sodium 134, potassium 3.6, chloride 97, CO2 28, BUN 23, creatinine 0.5, hemoglobin 8.4, hematocrit 27.4 Weight is 63.3 kg Blood pressure 136/56 Intake and output not documented.   Met with daughter and patient. Daughter brought in her hearing aid, plus she looked at a translator screen on the iPhone so she knew exactly what I was saying. She lives in [facility].   Discussed what heart failure means.  Patient states she had heard the term before but not in relationship to herself.  Went over diet, fluid restrictions, daily weight and changes in symptoms to report. Supplied patient with a scale from the heart failure clinic. Talked about how to do it daily using the rolling walker for stability.  She does not use salt at the table but states the food tastes very salty.  Daughter Vaughan Basta, states she plans to talk with staff to see if she is able to get low sodium foods.  Made aware of the heart failure clinic, she has an appointment on October 13 at 1:30 PM.   She usually fixes her own breakfast which consists of yogurt and toast.   Daughter states up to 3 weeks ago her mother was very involved with activities at the home, reading and writing poetry, etc.  They were given the living with heart failure teaching booklet, zone magnet, info on low sodium and  heart failure along with weight chart.  They had no further questions at this time.  Pricilla Riffle RN [facility]

## 2022-04-19 NOTE — Progress Notes (Signed)
Nutrition Brief Note  Patient identified on the Malnutrition Screening Tool (MST) Report  Spoke with patient at bedside. She reports having consistent PO intake with no significant recent changes. She reports eating breakfast in her apartment and 2 meals in the dining hall of her ILF.  She does not usually add salt to her meals as it is added into the recipes.   She denies recent weight loss although reports having a 10 lb weight increase d/t fluid retention.   Wt Readings from Last 15 Encounters:  04/19/22 63.3 kg  06/29/21 74.8 kg    Body mass index is 27.24 kg/m.  Current diet order is Heart Healthy, patient is consuming approximately 100% of meals at this time. Labs and medications reviewed.   No nutrition interventions warranted at this time. If nutrition issues arise, please consult RD.   Clayborne Dana, RDN, LDN Clinical Nutrition

## 2022-04-19 NOTE — Progress Notes (Signed)
PROGRESS NOTE    Mallory Oconnor  KVQ:[id number] DOB: Feb 23, 1932 DOA: 04/15/2022 PCP: Melonie Florida, FNP   Assessment & Plan:   Principal Problem:   AMS (altered mental status) Active Problems:   Chest pain   Essential hypertension   Falls   Anemia   CHF (congestive heart failure) (HCC)  Assessment and Plan: Encephalopathy: etiology unclear, likely metabolic. Mental status is close to baseline.  CT head shows no acute intracranial findings. Urine cx no growth. Blood cxs NGTD.   Acute diastolic CHF exacerbation: continue on lasix, aldactone. Metoprolol was d/c. Cardio following and recs apprec   Chest pain: likely non-cardiac. No chest pain today. Troponin neg x 2. Echo shows EF 60-65%, grade I diastolic dysfunction, MR & AR. Cardio following and recs apprec  Orthostatic hypotension: much improved today. Continue on lasix, aldactone. Metoprolol was d/c   IDA: continue on iron supplements   Fall & generalized weakness: PT recs HH. Hx of subdural bleed in Dec 2022.  HTN: continue on aldactone. D/c metoprolol secondary orthostatic hypotension  Hyponatremia: trending up today. Will continue to monitor   Leukocytosis: labile. Likely reactive   Thrombocytosis: resolved   DVT prophylaxis: lovenox Code Status: DNR Family Communication: called pt's daughter, [name], no answer so left a voicemail  Disposition Plan: likely d/c home w/ HH   Level of care: Telemetry Cardiac  Status is: Inpatient Remains inpatient appropriate because: severity of illness, still orthostatic today       Consultants:    Procedures:   Antimicrobials:  Subjective: Pt denies any complaints  Objective: Vitals:   04/18/22 1952 04/18/22 2327 04/19/22 0118 04/19/22 0549  BP: (!) 123/53 (!) 117/50 (!) 148/54 (!) 146/58  Pulse: 84 82 81 82  Resp: 17 20 16 18   Temp: 98 F (36.7 C) 98.9 F (37.2 C) 98.1 F (36.7 C) 97.8 F (36.6 C)  TempSrc:    Oral  SpO2: 96% 97% 94% 100%  Weight:     63.3 kg  Height:       No intake or output data in the 24 hours ending 04/19/22 0804  Filed Weights   04/15/22 1207 04/18/22 0343 04/19/22 0549  Weight: 80.7 kg 63.6 kg 63.3 kg    Examination:  General exam: Appears comfortable   Respiratory system: decreased breath sounds b/l  Cardiovascular system: S1/S2+. No rubs or clicks  Gastrointestinal system: Abd is soft, NT, ND & normal bowel sounds  Central nervous system: Alert and awake. Moves all extremities  Psychiatry: Judgement and insight appears close to baseline. Flat mood and affect    Data Reviewed: I have personally reviewed following labs and imaging studies  CBC: Recent Labs  Lab 04/15/22 1305 04/16/22 0050 04/17/22 0658 04/18/22 0453 04/19/22 0546  WBC 8.5 11.0* 11.0* 10.6* 11.0*  NEUTROABS 5.5  --   --   --   --   HGB 9.0* 8.0* 8.0* 8.4* 8.4*  HCT 30.2* 25.9* 26.4* 27.8* 27.4*  MCV 78.4* 77.8* 76.7* 77.2* 77.4*  PLT 398 370 410* 408* 384   Basic Metabolic Panel: Recent Labs  Lab 04/15/22 1305 04/16/22 0050 04/17/22 0658 04/18/22 0453 04/19/22 0546  NA 132*  --  133* 132* 134*  K 4.1  --  3.8 3.5 3.6  CL 94*  --  97* 95* 97*  CO2 29  --  29 27 28   GLUCOSE 147*  --  131* 136* 138*  BUN 16  --  20 26* 23  CREATININE 0.52 0.65 0.45 0.54 0.50  CALCIUM 9.6  --  9.2 9.1 9.3   GFR: Estimated Creatinine Clearance: 40.4 mL/min (by C-G formula based on SCr of 0.5 mg/dL). Liver Function Tests: Recent Labs  Lab 04/15/22 1305  AST 21  ALT 15  ALKPHOS 77  BILITOT 0.5  PROT 9.2*  ALBUMIN 2.8*   No results for input(s): "LIPASE", "AMYLASE" in the last 168 hours. No results for input(s): "AMMONIA" in the last 168 hours. Coagulation Profile: Recent Labs  Lab 04/15/22 1305  INR 1.5*   Cardiac Enzymes: No results for input(s): "CKTOTAL", "CKMB", "CKMBINDEX", "TROPONINI" in the last 168 hours. BNP (last 3 results) No results for input(s): "PROBNP" in the last 8760 hours. HbA1C: No results for  input(s): "HGBA1C" in the last 72 hours. CBG: No results for input(s): "GLUCAP" in the last 168 hours. Lipid Profile: No results for input(s): "CHOL", "HDL", "LDLCALC", "TRIG", "CHOLHDL", "LDLDIRECT" in the last 72 hours. Thyroid Function Tests: No results for input(s): "TSH", "T4TOTAL", "FREET4", "T3FREE", "THYROIDAB" in the last 72 hours. Anemia Panel: No results for input(s): "VITAMINB12", "FOLATE", "FERRITIN", "TIBC", "IRON", "RETICCTPCT" in the last 72 hours.  Sepsis Labs: Recent Labs  Lab 04/15/22 1305 04/15/22 1618  LATICACIDVEN 2.0* 1.3    Recent Results (from the past 240 hour(s))  Culture, blood (Routine x 2)     Status: None (Preliminary result)   Collection Time: 04/15/22  1:07 PM   Specimen: BLOOD  Result Value Ref Range Status   Specimen Description BLOOD BLOOD RIGHT HAND  Final   Special Requests   Final    BOTTLES DRAWN AEROBIC AND ANAEROBIC Blood Culture adequate volume   Culture   Final    NO GROWTH 4 DAYS Performed at [facility], [address] [place], [place] [address]    Report Status PENDING  Incomplete  Culture, blood (Routine x 2)     Status: None (Preliminary result)   Collection Time: 04/15/22  1:16 PM   Specimen: BLOOD  Result Value Ref Range Status   Specimen Description BLOOD BLOOD LEFT HAND  Final   Special Requests   Final    BOTTLES DRAWN AEROBIC AND ANAEROBIC Blood Culture results may not be optimal due to an inadequate volume of blood received in culture bottles   Culture   Final    NO GROWTH 4 DAYS Performed at [facility], [address] [place], [place] [address]    Report Status PENDING  Incomplete  Urine Culture     Status: None   Collection Time: 04/15/22  2:33 PM   Specimen: Urine, Clean Catch  Result Value Ref Range Status   Specimen Description   Final    URINE, CLEAN CATCH Performed at [facility], [address] [place], [place] [address]    Special Requests   Final     NONE Performed at [facility], [address] [place], [place] [address]    Culture   Final    NO GROWTH Performed at [facility], [address] [place], [place] [address]    Report Status 04/17/2022 FINAL  Final         Radiology Studies: No results found.      Scheduled Meds:  docusate sodium  200 mg Oral BID   doxycycline  100 mg Oral BID   enoxaparin (LOVENOX) injection  40 mg Subcutaneous Q24H   ferrous gluconate  324 mg Oral BID WC   furosemide  20 mg Oral Daily   lactulose  20  g Oral BID   melatonin  2.5 mg Oral QHS   pantoprazole  40 mg Oral Daily   spironolactone  25 mg Oral Daily   Continuous Infusions:   LOS: 2 days    Time spent: 25 mins     Wyvonnia Dusky, MD [facility] Pager [phone]  If 7PM-7AM, please contact night-coverage [email or web address] 04/19/2022, 8:04 AM

## 2022-04-19 NOTE — Progress Notes (Signed)
Physical Therapy Treatment Patient Details Name: Mallory Oconnor MRN: [id number] DOB: [birth year] Today's Date: 04/19/2022   History of Present Illness Mallory Oconnor is an 31yoF who comes to [facility] on 04/15/22 c fatigue and AMS, multiple falls at home. Pt admitted with medtabolic encephalopathy. DTR reports pt was dizzy a couple days prior after walk to dining room, decided to return to room.    PT Comments    Pt in bed on entry, DTR arrived not long before with recently serviced hearing aids that are now functioning. Pt reports to feel well in general, agreeable to session. No assist needed for mobility to EOB nor rising to standing, both of which appear to require mod effort. Pt assisted min-modA for underpants for mobility prep, able to attempt don/doff in session with bilat hands. Pt able to AMB at bedside twice alternating fwd/back to work on problem solving and balance. Pt then AMB out to desk and back into BR to finish up in BR for void attempt. Pt noted to be orthostatic earlier today with NSG, discussed with DTR about signs of acute hypotension and safety cues, education on avoiding valsalva when voiding. HR appropriately responsive to standing hypotension today, gets into 120s when AMB/standing without any SOB, CP, dizziness.    .Orthostatic VS for the past 24 hrs:  BP- Lying Pulse- Lying BP- Sitting Pulse- Sitting BP- Standing at 0 minutes Pulse- Standing at 0 minutes  04/19/22 0911 133/46 91 126/53 98 107/58 109         Recommendations for follow up therapy are one component of a multi-disciplinary discharge planning process, led by the attending physician.  Recommendations may be updated based on patient status, additional functional criteria and insurance authorization.  Follow Up Recommendations  Home health PT     Assistance Recommended at Discharge Intermittent Supervision/Assistance  Patient can return home with the following A little help with walking and/or transfers;A  little help with bathing/dressing/bathroom   Equipment Recommendations  None recommended by PT    Recommendations for Other Services       Precautions / Restrictions Precautions Precautions: Fall Precaution Comments: watch orthostatics Restrictions Weight Bearing Restrictions: No     Mobility  Bed Mobility Overal bed mobility: Needs Assistance Bed Mobility: Supine to Sit     Supine to sit: Supervision          Transfers Overall transfer level: Needs assistance Equipment used: Rolling walker (2 wheels) Transfers: Sit to/from Stand Sit to Stand: Supervision           General transfer comment: continued cues for hand placement    Ambulation/Gait Ambulation/Gait assistance: Min guard Gait Distance (Feet): 75 Feet Assistive device: Rolling walker (2 wheels) Gait Pattern/deviations: WFL(Within Functional Limits)       General Gait Details: after 2 other short walking bouts in room   Stairs             Wheelchair Mobility    Modified Rankin (Stroke Patients Only)       Balance                                            Cognition Arousal/Alertness: Awake/alert Behavior During Therapy: WFL for tasks assessed/performed Overall Cognitive Status: Within Functional Limits for tasks assessed  General Comments: hearing aids are fixed; she seems a bi tmore confused today, more flat, more delayed in response; DTR reports she seems more depressed.        Exercises Other Exercises Other Exercises: AMB forward 79ft, retro 5ft with RW, then return to sit for RW adjustment Other Exercises: AMB forward 19ft, retro 35ft, forward 41ft, retro 67ft  with RW,    General Comments        Pertinent Vitals/Pain Pain Assessment Pain Assessment: No/denies pain    Home Living                          Prior Function            PT Goals (current goals can now be found in the care plan  section) Acute Rehab PT Goals Patient Stated Goal: not be dizzy when up PT Goal Formulation: With patient Time For Goal Achievement: 05/01/22 Potential to Achieve Goals: Good Progress towards PT goals: Progressing toward goals    Frequency    Min 2X/week      PT Plan Current plan remains appropriate    Co-evaluation              AM-PAC PT "6 Clicks" Mobility   Outcome Measure  Help needed turning from your back to your side while in a flat bed without using bedrails?: None Help needed moving from lying on your back to sitting on the side of a flat bed without using bedrails?: None Help needed moving to and from a bed to a chair (including a wheelchair)?: A Little Help needed standing up from a chair using your arms (e.g., wheelchair or bedside chair)?: A Little Help needed to walk in hospital room?: A Little Help needed climbing 3-5 steps with a railing? : A Lot 6 Click Score: 19    End of Session Equipment Utilized During Treatment: Gait belt Activity Tolerance: Patient tolerated treatment well;No increased pain Patient left: in chair;with family/visitor present;with call bell/phone within reach Nurse Communication: Mobility status PT Visit Diagnosis: Unsteadiness on feet (R26.81);Other abnormalities of gait and mobility (R26.89)     Time: 3419-3790 PT Time Calculation (min) (ACUTE ONLY): 28 min  Charges:  $Gait Training: 8-22 mins $Therapeutic Activity: 8-22 mins                    2:54 PM, 04/19/22 Rosamaria Lints, PT, DPT Physical Therapist - [facility]  [phone] (ASCOM)     Adelynne Joerger C 04/19/2022, 2:49 PM

## 2022-04-19 NOTE — Progress Notes (Signed)
Progress Note  Patient Name: Mallory Oconnor Date of Encounter: 04/19/2022  Primary Cardiologist: New - [facility]  Subjective   No acute overnight events.   Inpatient Medications    Scheduled Meds:  docusate sodium  200 mg Oral BID   doxycycline  100 mg Oral BID   enoxaparin (LOVENOX) injection  40 mg Subcutaneous Q24H   ferrous gluconate  324 mg Oral BID WC   furosemide  20 mg Oral Daily   lactulose  20 g Oral BID   melatonin  2.5 mg Oral QHS   pantoprazole  40 mg Oral Daily   spironolactone  25 mg Oral Daily   Continuous Infusions:  PRN Meds: acetaminophen, bisacodyl, nitroGLYCERIN, ondansetron (ZOFRAN) IV   Vital Signs    Vitals:   04/18/22 1952 04/18/22 2327 04/19/22 0118 04/19/22 0549  BP: (!) 123/53 (!) 117/50 (!) 148/54 (!) 146/58  Pulse: 84 82 81 82  Resp: 17 20 16 18   Temp: 98 F (36.7 C) 98.9 F (37.2 C) 98.1 F (36.7 C) 97.8 F (36.6 C)  TempSrc:    Oral  SpO2: 96% 97% 94% 100%  Weight:    63.3 kg  Height:       No intake or output data in the 24 hours ending 04/19/22 0729 Filed Weights   04/15/22 1207 04/18/22 0343 04/19/22 0549  Weight: 80.7 kg 63.6 kg 63.3 kg    Telemetry    SR - Personally Reviewed  ECG    No new tracings - Personally Reviewed  Physical Exam   GEN: No acute distress.   Neck: No JVD. Cardiac: RRR, II/VI systolic murmur LSB, no rubs, or gallops.  Respiratory: Clear to auscultation bilaterally.  GI: Soft, nontender, non-distended.   MS: No edema; No deformity. Neuro:  Alert and oriented x 3; Nonfocal.  Psych: Normal affect.  Labs    Chemistry Recent Labs  Lab 04/15/22 1305 04/16/22 0050 04/17/22 0658 04/18/22 0453 04/19/22 0546  NA 132*  --  133* 132* 134*  K 4.1  --  3.8 3.5 3.6  CL 94*  --  97* 95* 97*  CO2 29  --  29 27 28   GLUCOSE 147*  --  131* 136* 138*  BUN 16  --  20 26* 23  CREATININE 0.52   < > 0.45 0.54 0.50  CALCIUM 9.6  --  9.2 9.1 9.3  PROT 9.2*  --   --   --   --   ALBUMIN 2.8*   --   --   --   --   AST 21  --   --   --   --   ALT 15  --   --   --   --   ALKPHOS 77  --   --   --   --   BILITOT 0.5  --   --   --   --   GFRNONAA >60   < > >60 >60 >60  ANIONGAP 9  --  7 10 9    < > = values in this interval not displayed.     Hematology Recent Labs  Lab 04/17/22 0658 04/18/22 0453 04/19/22 0546  WBC 11.0* 10.6* 11.0*  RBC 3.44* 3.60* 3.54*  HGB 8.0* 8.4* 8.4*  HCT 26.4* 27.8* 27.4*  MCV 76.7* 77.2* 77.4*  MCH 23.3* 23.3* 23.7*  MCHC 30.3 30.2 30.7  RDW 18.6* 18.6* 18.4*  PLT 410* 408* 384    Cardiac EnzymesNo results for input(s): "TROPONINI" in  the last 168 hours. No results for input(s): "TROPIPOC" in the last 168 hours.   BNP Recent Labs  Lab 04/15/22 1618  BNP 499.5*     DDimer No results for input(s): "DDIMER" in the last 168 hours.   Radiology    No results found.  Cardiac Studies   2D echo 04/16/2022: 1. Left ventricular ejection fraction, by estimation, is 60 to 65%. The  left ventricle has normal function. The left ventricle has no regional  wall motion abnormalities. There is mild left ventricular hypertrophy.  Left ventricular diastolic parameters  are consistent with Grade I diastolic dysfunction (impaired relaxation).   2. Right ventricular systolic function is normal. The right ventricular  size is normal. There is mildly elevated pulmonary artery systolic  pressure. The estimated right ventricular systolic pressure is 77.8 mmHg.   3. The mitral valve is normal in structure. Moderate mitral valve  regurgitation. No evidence of mitral stenosis.   4. The aortic valve is normal in structure. Aortic valve regurgitation is  mild to moderate. Aortic valve sclerosis is present, with no evidence of  aortic valve stenosis.   5. The inferior vena cava is normal in size with greater than 50%  respiratory variability, suggesting right atrial pressure of 3 mmHg.  Patient Profile     88 y.o. female with history of HTN, LBBB admitted  with AMS and dyspnea, who we are seeing for HFpEF.  Assessment & Plan    1. HFpEF: -Appears euvolemic and well compensated -Exacerbated by anemia -Continue low-dose Lasix and spironolactone -CHF education  2. Atypical chest pain: -Ruled out -Resolved with PPI -Echo with preserved LVSF and normal wall motion -No plans for inpatient ischemic testing  3. Mild to moderate mitral regurgitation: -Outpatient follow up  4. HTN: -Some positional dizziness noted -Permissive BP in the 242P systolic   5. Hyponatremia: -Improving -Per IM  6. Microcytic anemia: -Contributing to overall presentation -Per IM       For questions or updates, please contact [place] Please consult [email or web address] for contact info under Cardiology/STEMI.    Signed, Christell Faith, PA-C [place] Pager: [phone] 04/19/2022, 7:29 AM

## 2022-04-20 DIAGNOSIS — R531 Weakness: Secondary | ICD-10-CM | POA: Diagnosis not present

## 2022-04-20 DIAGNOSIS — I951 Orthostatic hypotension: Secondary | ICD-10-CM | POA: Diagnosis not present

## 2022-04-20 DIAGNOSIS — I5031 Acute diastolic (congestive) heart failure: Secondary | ICD-10-CM | POA: Diagnosis not present

## 2022-04-20 LAB — CULTURE, BLOOD (ROUTINE X 2)
Culture: NO GROWTH
Culture: NO GROWTH
Special Requests: ADEQUATE

## 2022-04-20 LAB — BASIC METABOLIC PANEL WITH GFR
Anion gap: 7 (ref 5–15)
BUN: 25 mg/dL — ABNORMAL HIGH (ref 8–23)
CO2: 29 mmol/L (ref 22–32)
Calcium: 9 mg/dL (ref 8.9–10.3)
Chloride: 93 mmol/L — ABNORMAL LOW (ref 98–111)
Creatinine, Ser: 0.54 mg/dL (ref 0.44–1.00)
GFR, Estimated: >60 mL/min
Glucose, Bld: 152 mg/dL — ABNORMAL HIGH (ref 70–99)
Potassium: 3.9 mmol/L (ref 3.5–5.1)
Sodium: 129 mmol/L — ABNORMAL LOW (ref 135–145)

## 2022-04-20 LAB — CBC
HCT: 27.7 % — ABNORMAL LOW (ref 36.0–46.0)
Hemoglobin: 8.3 g/dL — ABNORMAL LOW (ref 12.0–15.0)
MCH: 23.6 pg — ABNORMAL LOW (ref 26.0–34.0)
MCHC: 30 g/dL (ref 30.0–36.0)
MCV: 78.7 fL — ABNORMAL LOW (ref 80.0–100.0)
Platelets: 418 K/uL — ABNORMAL HIGH (ref 150–400)
RBC: 3.52 MIL/uL — ABNORMAL LOW (ref 3.87–5.11)
RDW: 18.9 % — ABNORMAL HIGH (ref 11.5–15.5)
WBC: 11.4 K/uL — ABNORMAL HIGH (ref 4.0–10.5)
nRBC: 0 % (ref 0.0–0.2)

## 2022-04-20 MED ORDER — METOPROLOL TARTRATE 5 MG/5ML IV SOLN
5.0000 mg | Freq: Four times a day (QID) | INTRAVENOUS | Status: DC | PRN
  Administered 2022-04-21: 5 mg via INTRAVENOUS
  Filled 2022-04-20: qty 5

## 2022-04-20 NOTE — Evaluation (Signed)
Clinical/Bedside Swallow Evaluation Patient Details  Name: Mallory Oconnor MRN: [id number] Date of Birth: February 06, 1932  Today's Date: 04/20/2022 Time: SLP Start Time (ACUTE ONLY): 9381 SLP Stop Time (ACUTE ONLY): 1445 SLP Time Calculation (min) (ACUTE ONLY): 60 min  Past Medical History:  Past Medical History:  Diagnosis Date   Hypertension    Past Surgical History: History reviewed. No pertinent surgical history. HPI:  Pt  is a 88 y.o. female with medical history significant for [facility], Hypertension, falls with injury including subdural bleed in December 2022, currently on doxycycline for a lung infection, who was sent to the ED because of an abnormal EKG after she presented with dizziness, an episode of vomiting the day prior and intermittent chest pain over the previous 4 days.  She has also been weak, confused and having frequent falls.  Patient's confusion has apparently been going on for about 2 weeks and started after she was moved out of her room at the facility because of bedbugs.  After she moved back into her room she continued to become confused and it seemed to be worsening.  As per Dr. Jimmye Oconnor 10/6-10/10/23: Pt initially presented w/ encephalopathy of unknown etiology. CT head showed no acute intracranial findings. Blood cxs NGTD. Urine cx no growth. Pt's mental status is close to baseline. Of note, pt was found to have acute diastolic CHF and cardio was consulted as pt did not have a cardiologist outpatient yet and pt's family requested it as well. Pt is currently on lasix, aldactone.   CXR: Trace bilateral pleural effusions with minimal bibasilar  atelectasis.   Head CT: negative.    Assessment / Plan / Recommendation  Clinical Impression   Pt seen today for BSE. Pt awake, engaged easily but needed verbal/tactile cues for follow through intermittently -- pt is extremely HOH. Daughter present and supported pt in using a supportive device (phone) for verbal text communication for pt to  read to aid her hearing. Pt answered basic questions re: self; some redirection/cues necessary but suspect mostly related to her hearing deficits. Pt on RA; afebrile. Min tray setup given, support.  Pt appears to present w/ quite adequate oropharyngeal phase swallow function w/ No oropharyngeal phase dysphagia noted, No neuromuscular deficits noted. Pt consumed po trials w/ No immediate, overt, clinical s/s of aspiration during po trials. Pt appears at reduced risk for aspiration following general aspiration precautions.   However, pt does have challenging factors that could impact her oropharyngeal swallowing to include cardiopulmonary decline; fatigue/weakness w/ exertion, and advanced age w/ hearing deficits. These factors can increase risk for aspiration, dysphagia as well as decreased oral intake overall.   During po trials, pt consumed all consistencies w/ no overt coughing, decline in vocal quality, or change in respiratory presentation during/post trials. No increased WOB/RR noted -- rest breaks were given b/t trials to lessen exertion. Discussed the need for Rest Breaks during meals w/ Dtr and pt d/t generalized weakness and pt's own Cardiopulmonary decline. Oral phase appeared grossly [facility] w/ timely bolus management, mastication, and control of bolus propulsion for A-P transfer for swallowing. Oral clearing achieved w/ all trial consistencies -- moistened, soft foods given.  OM Exam appeared [facility] w/ no unilateral weakness noted. Speech Clear. Pt fed self w/ setup support.  Recommend a fairly Regular consistency diet w/ well-Cut meats, moistened foods; Thin liquids --  NO STRAWS for best control during drinking. Recommend general aspiration precautions, Rest Breaks during meals to lessen fatigue/exertion/WOB. Pills WHOLE in Puree for safer,  easier swallowing.  Education given on Pills in Puree; food consistencies and easy to eat options; general aspiration precautions to pt and Dtr. Handouts. NSG  to reconsult if any new needs arise. NSG updated, agreed. MD updated. Recommend Dietician f/u for support. SLP Visit Diagnosis: Dysphagia, unspecified (R13.10)    Aspiration Risk   (reduced following general precautions)    Diet Recommendation   a fairly Regular consistency diet w/ well-Cut meats, moistened foods; Thin liquids --  NO STRAWS for best control during drinking. Recommend general aspiration precautions, Rest Breaks during meals to lessen fatigue/exertion/WOB.   Medication Administration: Whole meds with puree (for safer swallowing)    Other  Recommendations Recommended Consults:  (Dietician f/u) Oral Care Recommendations: Oral care BID;Oral care before and after PO;Patient independent with oral care (setup support) Other Recommendations:  (n/a)    Recommendations for follow up therapy are one component of a multi-disciplinary discharge planning process, led by the attending physician.  Recommendations may be updated based on patient status, additional functional criteria and insurance authorization.  Follow up Recommendations No SLP follow up      Assistance Recommended at Discharge Set up Supervision/Assistance  Functional Status Assessment Patient has had a recent decline in their functional status and demonstrates the ability to make significant improvements in function in a reasonable and predictable amount of time.  Frequency and Duration  (n/a)   (n/a)       Prognosis Prognosis for Safe Diet Advancement: Good Barriers to Reach Goals: Cognitive deficits;Time post onset;Severity of deficits Barriers/Prognosis Comment: difficulty swallowing medications reported      Swallow Study   General Date of Onset: 04/15/22 HPI: Pt  is a 88 y.o. female with medical history significant for [facility], Hypertension, falls with injury including subdural bleed in December 2022, currently on doxycycline for a lung infection, who was sent to the ED because of an abnormal EKG after she  presented with dizziness, an episode of vomiting the day prior and intermittent chest pain over the previous 4 days.  She has also been weak, confused and having frequent falls.  Patient's confusion has apparently been going on for about 2 weeks and started after she was moved out of her room at the facility because of bedbugs.  After she moved back into her room she continued to become confused and it seemed to be worsening.  As per Dr. Mayford Knife 10/6-10/10/23: Pt initially presented w/ encephalopathy of unknown etiology. CT head showed no acute intracranial findings. Blood cxs NGTD. Urine cx no growth. Pt's mental status is close to baseline. Of note, pt was found to have acute diastolic CHF and cardio was consulted as pt did not have a cardiologist outpatient yet and pt's family requested it as well. Pt is currently on lasix, aldactone.   CXR: Trace bilateral pleural effusions with minimal bibasilar  atelectasis.   Head CT: negative. Type of Study: Bedside Swallow Evaluation Previous Swallow Assessment: none Diet Prior to this Study: Regular;Thin liquids Temperature Spikes Noted: No (wbc 11.4) Respiratory Status: Room air History of Recent Intubation: No Behavior/Cognition: Alert;Cooperative;Pleasant mood;Distractible;Requires cueing (HOH) Oral Cavity Assessment: Within Functional Limits Oral Care Completed by SLP: Recent completion by staff Oral Cavity - Dentition: Adequate natural dentition Vision: Functional for self-feeding Self-Feeding Abilities: Able to feed self;Needs set up;Needs assist Patient Positioning: Upright in bed (supported positioning given) Baseline Vocal Quality: Normal Volitional Cough: Strong Volitional Swallow: Able to elicit    Oral/Motor/Sensory Function Overall Oral Motor/Sensory Function: Within functional limits   Ice  Chips Ice chips: Within functional limits Presentation: Spoon (fed; 2 trials)   Thin Liquid Thin Liquid: Within functional limits Presentation:  Cup;Self Fed (~8 ozs) Other Comments: water, juice    Nectar Thick Nectar Thick Liquid: Not tested   Honey Thick Honey Thick Liquid: Not tested   Puree Puree: Within functional limits Presentation: Self Fed;Spoon (7-8 trials)   Solid     Solid: Within functional limits Presentation: Self Fed (5-6 trials) Other Comments: moistened well w/ applesauce        Jerilynn Som, MS, CCC-SLP Speech Language Pathologist Rehab Services; [facility] [place] [phone] (ascom) Zanasia Hickson 04/20/2022,5:11 PM

## 2022-04-20 NOTE — Care Management Important Message (Signed)
Important Message  Patient Details  Name: Mallory Oconnor MRN: [id number] Date of Birth: 1931/09/04   Medicare Important Message Given:  Yes     Dannette Shayana 04/20/2022, 10:41 AM

## 2022-04-20 NOTE — Progress Notes (Signed)
PROGRESS NOTE   HPI was taken from Dr. Para March: Mallory Oconnor is a 88 y.o. female with medical history significant for Hypertension, falls with injury including subdural bleed in December 2022, currently on doxycycline for a lung infection, who was sent to the ED because of an abnormal EKG after she presented with dizziness, an episode of vomiting the day prior and intermittent chest pain over the previous 4 days.  She has also been weak, confused and having frequent falls.  Patient's confusion has apparently been going on for about 2 weeks and started after she was moved out of her room at the facility because of bedbugs.  After she moved back into her room she continued to become confused and it seemed to be worsening.   ED course and data review: BP 135/58 with otherwise normal vitals.  Troponin 12, BNP 499.  WBC WNL, Hb 9, down from 11 9 months prior.  Urinalysis unremarkable, COVID and flu pending.  EKG, personally viewed and interpreted shows NSR at 79 with LVH shows ST elevations on V1 and V2, but this was present on V3 and V4 in December 2022 and probably related to LVH.  Chest x-ray showed trace bilateral pleural effusions with minimal basilar atelectasis and head CT showed no acute intracranial process.  Patient was treated with an IV Lasix 20 mg dose.  Hospitalist consulted for admission.   As per Dr. Mayford Knife 10/6-10/10/23: Pt initially presented w/ encephalopathy of unknown etiology. CT head showed no acute intracranial findings. Blood cxs NGTD. Urine cx no growth. Pt's mental status is close to baseline. Of note, pt was found to have acute diastolic CHF and cardio was consulted as pt did not have a cardiologist outpatient yet and pt's family requested it as well. Pt is currently on lasix, aldactone. Pt unable tolerate metoprolol secondary to orthostatic hypotension. Orthostatic hypotension improved on 04/19/22. Also, there is a concern for aspiration so speech was consulted today.      Mallory Oconnor  VEL:[id number] DOB: 01-28-1932 DOA: 04/15/2022 PCP: Melonie Florida, FNP   Assessment & Plan:   Principal Problem:   AMS (altered mental status) Active Problems:   Chest pain   Essential hypertension   Falls   Anemia   CHF (congestive heart failure) (HCC)   Acute diastolic heart failure (HCC)  Assessment and Plan: Encephalopathy: etiology unclear, likely metabolic. Mental status is close to baseline.  CT head shows no acute intracranial findings. Urine cx no growth. Blood cxs NGTD.   Acute diastolic CHF exacerbation: continue on aldactone, lasix. Metoprolol was d/c. Will need outpatient f/u w/ cardio, Dr. Azucena Cecil, in 1-2 weeks. No further cardiac work-up inpatient as per cardio   Chest pain: likely non-cardiac. No chest pain today. Troponin neg x 2. Echo shows EF 60-65%, grade I diastolic dysfunction, MR & AR. Cardio recs apprec   Orthostatic hypotension: improved. Continue on lasix, aldactone. Orthostatic vitals ordered daily   Possible aspiration: coughing after eating and drinking intermittently. Speech consulted   IDA: continue on iron supplements   Fall & generalized weakness: PT recs HH. Hx of subdural bleed in Dec 2022.  HTN: continue on aldactone, lasix. Metoprolol was d/c   Hyponatremia: labile. Will continue to monitor   Leukocytosis: labile. Likely reactive   Thrombocytosis: labile. Will continue to monitor    DVT prophylaxis: lovenox Code Status: DNR Family Communication: discussed pt's care w/ pt's daughter and answered her questions  Disposition Plan: likely d/c home w/ HH   Level of care:  Telemetry Cardiac  Status is: Inpatient Remains inpatient appropriate because: severity of illness, can likely d/c tomorrow       Consultants:    Procedures:   Antimicrobials:  Subjective: Pt c/o coughing after eating/drinking intermittently   Objective: Vitals:   04/20/22 0126 04/20/22 0403 04/20/22 0631 04/20/22 0745  BP:  (!) 144/71 (!) 154/64 (!) 122/55 (!) 152/61  Pulse: (!) 115 (!) 117 (!) 117 (!) 119  Resp: 16 17 16 18   Temp: 98.4 F (36.9 C) 98.4 F (36.9 C) 98.5 F (36.9 C) 98.8 F (37.1 C)  TempSrc:  Oral Oral Oral  SpO2: 97% 98% 96% 97%  Weight:  64.4 kg    Height:        Intake/Output Summary (Last 24 hours) at 04/20/2022 0827 Last data filed at 04/19/2022 1821 Gross per 24 hour  Intake 600 ml  Output --  Net 600 ml    Filed Weights   04/18/22 0343 04/19/22 0549 04/20/22 0403  Weight: 63.6 kg 63.3 kg 64.4 kg    Examination:  General exam: Appears uncomfortable  Respiratory system: diminished breath sounds b/l  Cardiovascular system: S1 & S2+. No rubs or clicks  Gastrointestinal system: Abd is soft, NT, ND & hypoactive bowel sounds  Central nervous system: Alert and awake. Moves all extremities  Psychiatry: Judgement and insight appears close to baseline. Flat mood and affect     Data Reviewed: I have personally reviewed following labs and imaging studies  CBC: Recent Labs  Lab 04/15/22 1305 04/16/22 0050 04/17/22 0658 04/18/22 0453 04/19/22 0546 04/20/22 0450  WBC 8.5 11.0* 11.0* 10.6* 11.0* 11.4*  NEUTROABS 5.5  --   --   --   --   --   HGB 9.0* 8.0* 8.0* 8.4* 8.4* 8.3*  HCT 30.2* 25.9* 26.4* 27.8* 27.4* 27.7*  MCV 78.4* 77.8* 76.7* 77.2* 77.4* 78.7*  PLT 398 370 410* 408* 384 361*   Basic Metabolic Panel: Recent Labs  Lab 04/15/22 1305 04/16/22 0050 04/17/22 0658 04/18/22 0453 04/19/22 0546 04/20/22 0450  NA 132*  --  133* 132* 134* 129*  K 4.1  --  3.8 3.5 3.6 3.9  CL 94*  --  97* 95* 97* 93*  CO2 29  --  29 27 28 29   GLUCOSE 147*  --  131* 136* 138* 152*  BUN 16  --  20 26* 23 25*  CREATININE 0.52 0.65 0.45 0.54 0.50 0.54  CALCIUM 9.6  --  9.2 9.1 9.3 9.0   GFR: Estimated Creatinine Clearance: 40.7 mL/min (by C-G formula based on SCr of 0.54 mg/dL). Liver Function Tests: Recent Labs  Lab 04/15/22 1305  AST 21  ALT 15  ALKPHOS 77  BILITOT  0.5  PROT 9.2*  ALBUMIN 2.8*   No results for input(s): "LIPASE", "AMYLASE" in the last 168 hours. No results for input(s): "AMMONIA" in the last 168 hours. Coagulation Profile: Recent Labs  Lab 04/15/22 1305  INR 1.5*   Cardiac Enzymes: No results for input(s): "CKTOTAL", "CKMB", "CKMBINDEX", "TROPONINI" in the last 168 hours. BNP (last 3 results) No results for input(s): "PROBNP" in the last 8760 hours. HbA1C: No results for input(s): "HGBA1C" in the last 72 hours. CBG: No results for input(s): "GLUCAP" in the last 168 hours. Lipid Profile: No results for input(s): "CHOL", "HDL", "LDLCALC", "TRIG", "CHOLHDL", "LDLDIRECT" in the last 72 hours. Thyroid Function Tests: No results for input(s): "TSH", "T4TOTAL", "FREET4", "T3FREE", "THYROIDAB" in the last 72 hours. Anemia Panel: No results for input(s): "VITAMINB12", "  FOLATE", "FERRITIN", "TIBC", "IRON", "RETICCTPCT" in the last 72 hours.  Sepsis Labs: Recent Labs  Lab 04/15/22 1305 04/15/22 1618  LATICACIDVEN 2.0* 1.3    Recent Results (from the past 240 hour(s))  Culture, blood (Routine x 2)     Status: None (Preliminary result)   Collection Time: 04/15/22  1:07 PM   Specimen: BLOOD  Result Value Ref Range Status   Specimen Description BLOOD BLOOD RIGHT HAND  Final   Special Requests   Final    BOTTLES DRAWN AEROBIC AND ANAEROBIC Blood Culture adequate volume   Culture   Final    NO GROWTH 4 DAYS Performed at [facility], [address], [place], [place] [address]    Report Status PENDING  Incomplete  Culture, blood (Routine x 2)     Status: None (Preliminary result)   Collection Time: 04/15/22  1:16 PM   Specimen: BLOOD  Result Value Ref Range Status   Specimen Description BLOOD BLOOD LEFT HAND  Final   Special Requests   Final    BOTTLES DRAWN AEROBIC AND ANAEROBIC Blood Culture results may not be optimal due to an inadequate volume of blood received in culture bottles   Culture   Final    NO  GROWTH 4 DAYS Performed at [facility], [address] [place], [place] [address]    Report Status PENDING  Incomplete  Urine Culture     Status: None   Collection Time: 04/15/22  2:33 PM   Specimen: Urine, Clean Catch  Result Value Ref Range Status   Specimen Description   Final    URINE, CLEAN CATCH Performed at [facility], [address], [place], [place] [address]    Special Requests   Final    NONE Performed at [facility], [address] [place], [place] [address]    Culture   Final    NO GROWTH Performed at [facility], [address], [place], [place] [address]    Report Status 04/17/2022 FINAL  Final         Radiology Studies: No results found.      Scheduled Meds:  docusate sodium  200 mg Oral BID   enoxaparin (LOVENOX) injection  40 mg Subcutaneous Q24H   ferrous gluconate  324 mg Oral BID WC   furosemide  20 mg Oral Daily   lactulose  20 g Oral BID   melatonin  2.5 mg Oral QHS   pantoprazole  40 mg Oral Daily   spironolactone  25 mg Oral Daily   Continuous Infusions:   LOS: 3 days    Time spent: 25 mins     Charise Killian, MD [facility] Pager [phone]  If 7PM-7AM, please contact night-coverage [email or web address] 04/20/2022, 8:27 AM

## 2022-04-21 DIAGNOSIS — I5031 Acute diastolic (congestive) heart failure: Secondary | ICD-10-CM | POA: Diagnosis not present

## 2022-04-21 DIAGNOSIS — I10 Essential (primary) hypertension: Secondary | ICD-10-CM | POA: Diagnosis not present

## 2022-04-21 DIAGNOSIS — E663 Overweight: Secondary | ICD-10-CM | POA: Diagnosis not present

## 2022-04-21 DIAGNOSIS — R072 Precordial pain: Secondary | ICD-10-CM | POA: Diagnosis not present

## 2022-04-21 LAB — CBC
HCT: 26.4 % — ABNORMAL LOW (ref 36.0–46.0)
Hemoglobin: 8 g/dL — ABNORMAL LOW (ref 12.0–15.0)
MCH: 23.3 pg — ABNORMAL LOW (ref 26.0–34.0)
MCHC: 30.3 g/dL (ref 30.0–36.0)
MCV: 77 fL — ABNORMAL LOW (ref 80.0–100.0)
Platelets: 399 10*3/uL (ref 150–400)
RBC: 3.43 MIL/uL — ABNORMAL LOW (ref 3.87–5.11)
RDW: 18.6 % — ABNORMAL HIGH (ref 11.5–15.5)
WBC: 12 10*3/uL — ABNORMAL HIGH (ref 4.0–10.5)
nRBC: 0 % (ref 0.0–0.2)

## 2022-04-21 LAB — BASIC METABOLIC PANEL
Anion gap: 10 (ref 5–15)
BUN: 18 mg/dL (ref 8–23)
CO2: 27 mmol/L (ref 22–32)
Calcium: 9.1 mg/dL (ref 8.9–10.3)
Chloride: 96 mmol/L — ABNORMAL LOW (ref 98–111)
Creatinine, Ser: 0.52 mg/dL (ref 0.44–1.00)
GFR, Estimated: >60 mL/min (ref >60)
Glucose, Bld: 147 mg/dL — ABNORMAL HIGH (ref 70–99)
Potassium: 4.2 mmol/L (ref 3.5–5.1)
Sodium: 133 mmol/L — ABNORMAL LOW (ref 135–145)

## 2022-04-21 LAB — PROCALCITONIN: Procalcitonin: <0.1 ng/mL

## 2022-04-21 MED ORDER — METOPROLOL TARTRATE 25 MG PO TABS
25.0000 mg | ORAL_TABLET | Freq: Two times a day (BID) | ORAL | Status: DC
  Administered 2022-04-21: 25 mg via ORAL
  Filled 2022-04-21: qty 1

## 2022-04-21 MED ORDER — FUROSEMIDE 20 MG PO TABS: 20.0000 mg | ORAL_TABLET | Freq: Every day | ORAL | 1 refills | Status: DC

## 2022-04-21 MED ORDER — FERROUS GLUCONATE 324 (38 FE) MG PO TABS: 324.0000 mg | ORAL_TABLET | Freq: Every day | ORAL | 1 refills | Status: DC

## 2022-04-21 MED ORDER — METOPROLOL TARTRATE 25 MG PO TABS: 12.5000 mg | ORAL_TABLET | Freq: Two times a day (BID) | ORAL | 1 refills | Status: DC

## 2022-04-21 MED ORDER — SPIRONOLACTONE 25 MG PO TABS: 25.0000 mg | ORAL_TABLET | Freq: Every day | ORAL | 1 refills | Status: DC

## 2022-04-21 NOTE — TOC Progression Note (Signed)
Transition of Care [facility]) - Progression Note    Patient Details  Name: Mallory Oconnor MRN: [id number] Date of Birth: 1932-05-25  Transition of Care [facility]) CM/SW [place], Onset Phone Number: 04/21/2022, 9:07 AM  Clinical Narrative:     CSW notes patient is set up with [facility] for PT and OT services, Wheel chair was ordered via [employer] to be delivered to patient's home on 10/9.   TOC will continue to follow for discharge planning needs.      Barriers to Discharge: Continued Medical Work up  Expected Discharge Plan and Services                           DME Arranged: Wheelchair manual DME Agency: [employer] Date DME Agency Contacted: 04/17/22 Time DME Agency Contacted: 39   [place] Arranged: PT, OT           Social Determinants of Health (SDOH) Interventions    Readmission Risk Interventions     No data to display

## 2022-04-21 NOTE — Discharge Summary (Signed)
Physician Discharge Summary   Patient: Mallory S [place] MRN: [id number] DOB: [birth year]  Admit date:     04/15/2022  Discharge date: 04/21/2022  Discharge Physician: Hollice EspySendil K Tamitha Norell   PCP: Melonie FloridaGregory, Traci, FNP   Recommendations at discharge:   New medication: Fergon 324 p.o. daily New medication: Lasix 20 mg p.o. daily Medication change: Lopressor decreased from 50 mg p.o. twice daily down to 12.5 mg p.o. daily New medication: Spironolactone 25 mg p.o. daily  Discharge Diagnoses: Principal Problem:   AMS (altered mental status) Active Problems:   Chest pain   Essential hypertension   Falls   Anemia   CHF (congestive heart failure) (HCC)   Acute diastolic heart failure (HCC)  Resolved Problems:   * No resolved hospital problems. *  Hospital Course: 88 year old female with past medical history of subdural bleed and hypertension who presented to the emergency room on 10/5 after she presented with dizziness and intermittent chest pain.  In the emergency room, work-up unrevealing although did note to have acute diastolic CHF.  Started on IV Lasix and cardiology consulted.  Assessment and Plan: Encephalopathy: Resolved.  Etiology unclear, likely metabolic. Mental status is close to baseline.  CT head shows no acute intracranial findings. Urine cx no growth. Blood cxs NGTD.    Acute diastolic CHF exacerbation: continue on aldactone, lasix.  Patient had been on metoprolol and this was discontinued following episodes of orthostatic hypotension, however started becoming tachycardic and so metoprolol resumed at lower dose.  Felt to be fully diuresed by 10/11 and patient will follow-up with cardiology as outpatient.  By time of discharge, patient had diuresed almost 3.5 L and was almost -2 L deficient.   Chest pain: likely non-cardiac. No chest pain today. Troponin neg x 2. Echo shows EF 60-65%, grade I diastolic dysfunction, MR & AR. Cardio recs apprec    Orthostatic hypotension:  improved. Continue on lasix, aldactone. Orthostatic vitals ordered daily    Possible aspiration: coughing after eating and drinking intermittently.  Passed swallow evaluation   IDA: continue on iron supplements, prescription given on discharge   Fall & generalized weakness: PT recs HH. Hx of subdural bleed in Dec 2022.   HTN: New prescriptions for Lasix and spironolactone given on discharge as well as lower dose of metoprolol   Hyponatremia: Likely secondary to hypervolemic hyponatremia.  Sodium at 133 by day of discharge   Leukocytosis: labile. Likely reactive, no signs of infection   Thrombocytosis: labile. Will continue to monitor   Overweight: Meets criteria with BMI greater than 25       Consultants: Cardiology Procedures performed: Echocardiogram done 10/6 noted grade 1 diastolic dysfunction and preserved ejection fraction Disposition: Home with home health Diet recommendation:  Discharge Diet Orders (From admission, onward)     Start     Ordered   04/21/22 0000  Diet - low sodium heart healthy        04/21/22 1235           Heart healthy DISCHARGE MEDICATION: Allergies as of 04/21/2022   No Known Allergies      Medication List     STOP taking these medications    doxycycline 100 MG tablet Commonly known as: VIBRA-TABS       TAKE these medications    ferrous gluconate 324 MG tablet Commonly known as: FERGON Take 1 tablet (324 mg total) by mouth daily with breakfast.   furosemide 20 MG tablet Commonly known as: LASIX Take 1 tablet (20 mg total) by  mouth daily.   hydrochlorothiazide 25 MG tablet Commonly known as: HYDRODIURIL Take 25 mg by mouth daily.   melatonin 3 MG Tabs tablet Take 3 mg by mouth at bedtime.   metoprolol tartrate 25 MG tablet Commonly known as: LOPRESSOR Take 0.5 tablets (12.5 mg total) by mouth 2 (two) times daily. What changed:  medication strength how much to take   pantoprazole 40 MG tablet Commonly known as:  PROTONIX Take 40 mg by mouth daily.   spironolactone 25 MG tablet Commonly known as: ALDACTONE Take 1 tablet (25 mg total) by mouth daily.        Follow-up Information     Melonie Florida, FNP. Go in 1 week(s).   Specialty: Family Medicine Why: Appointment on Monday, 04/26/2022. Traci Gregory's office will call and let you know what time. Contact information: [address] [place] [address] [phone]                Discharge Exam: Ceasar Mons Weights   04/19/22 0549 04/20/22 0403 04/21/22 0354  Weight: 63.3 kg 64.4 kg 60.7 kg   General: Alert and oriented x2, no acute distress Cardiovascular: Regular rate and rhythm, S1-S2 Lungs: Clear to auscultation bilaterally  Condition at discharge: good  The results of significant diagnostics from this hospitalization (including imaging, microbiology, ancillary and laboratory) are listed below for reference.   Imaging Studies: ECHOCARDIOGRAM COMPLETE  Result Date: 04/16/2022    ECHOCARDIOGRAM REPORT   Patient Name:   Mallory Oconnor Date of Exam: 04/16/2022 Medical Rec #:  [id number]        Height:       60.0 in Accession #:    [id number]       Weight:       178.0 lb Date of Birth:  1932/03/26        BSA:          1.776 m Patient Age:    88 years         BP:           136/50 mmHg Patient Gender: F                HR:           61 bpm. Exam Location:  [facility] Procedure: 2D Echo, Cardiac Doppler and Color Doppler Indications:     Chest pain R07.9  History:         Patient has no prior history of Echocardiogram examinations.                  Risk Factors:Hypertension.  Sonographer:     Cristela Blue Referring Phys:  [id number] Andris Baumann Diagnosing Phys: Julien Nordmann MD  Sonographer Comments: Image quality was fair. IMPRESSIONS  1. Left ventricular ejection fraction, by estimation, is 60 to 65%. The left ventricle has normal function. The left ventricle has no regional wall motion abnormalities. There is mild left ventricular  hypertrophy. Left ventricular diastolic parameters are consistent with Grade I diastolic dysfunction (impaired relaxation).  2. Right ventricular systolic function is normal. The right ventricular size is normal. There is mildly elevated pulmonary artery systolic pressure. The estimated right ventricular systolic pressure is 44.2 mmHg.  3. The mitral valve is normal in structure. Moderate mitral valve regurgitation. No evidence of mitral stenosis.  4. The aortic valve is normal in structure. Aortic valve regurgitation is mild to moderate. Aortic valve sclerosis is present, with no evidence of aortic valve stenosis.  5. The inferior  vena cava is normal in size with greater than 50% respiratory variability, suggesting right atrial pressure of 3 mmHg. FINDINGS  Left Ventricle: Left ventricular ejection fraction, by estimation, is 60 to 65%. The left ventricle has normal function. The left ventricle has no regional wall motion abnormalities. The left ventricular internal cavity size was normal in size. There is  mild left ventricular hypertrophy. Left ventricular diastolic parameters are consistent with Grade I diastolic dysfunction (impaired relaxation). Right Ventricle: The right ventricular size is normal. No increase in right ventricular wall thickness. Right ventricular systolic function is normal. There is mildly elevated pulmonary artery systolic pressure. The tricuspid regurgitant velocity is 3.13  m/s, and with an assumed right atrial pressure of 5 mmHg, the estimated right ventricular systolic pressure is 44.2 mmHg. Left Atrium: Left atrial size was normal in size. Right Atrium: Right atrial size was normal in size. Pericardium: There is no evidence of pericardial effusion. Mitral Valve: The mitral valve is normal in structure. Moderate mitral valve regurgitation. No evidence of mitral valve stenosis. Tricuspid Valve: The tricuspid valve is normal in structure. Tricuspid valve regurgitation is not  demonstrated. No evidence of tricuspid stenosis. Aortic Valve: The aortic valve is normal in structure. Aortic valve regurgitation is mild to moderate. Aortic regurgitation PHT measures 559 msec. Aortic valve sclerosis is present, with no evidence of aortic valve stenosis. Aortic valve mean gradient measures 7.0 mmHg. Aortic valve peak gradient measures 13.1 mmHg. Aortic valve area, by VTI measures 2.03 cm. Pulmonic Valve: The pulmonic valve was normal in structure. Pulmonic valve regurgitation is not visualized. No evidence of pulmonic stenosis. Aorta: The aortic root is normal in size and structure. Venous: The inferior vena cava is normal in size with greater than 50% respiratory variability, suggesting right atrial pressure of 3 mmHg. IAS/Shunts: The interatrial septum appears to be lipomatous. No atrial level shunt detected by color flow Doppler.  LEFT VENTRICLE PLAX 2D LVIDd:         4.20 cm   Diastology LVIDs:         2.80 cm   LV e' medial:    6.42 cm/s LV PW:         1.00 cm   LV E/e' medial:  14.9 LV IVS:        0.90 cm   LV e' lateral:   7.18 cm/s LVOT diam:     1.90 cm   LV E/e' lateral: 13.3 LV SV:         84 LV SV Index:   47 LVOT Area:     2.84 cm  RIGHT VENTRICLE RV Basal diam:  2.60 cm RV Mid diam:    2.10 cm RV S prime:     15.70 cm/s TAPSE (M-mode): 2.6 cm LEFT ATRIUM             Index        RIGHT ATRIUM           Index LA diam:        3.00 cm 1.69 cm/m   RA Area:     11.60 cm LA Vol (A2C):   36.5 ml 20.55 ml/m  RA Volume:   22.10 ml  12.44 ml/m LA Vol (A4C):   18.1 ml 10.19 ml/m LA Biplane Vol: 27.7 ml 15.59 ml/m  AORTIC VALVE AV Area (Vmax):    1.99 cm AV Area (Vmean):   1.92 cm AV Area (VTI):     2.03 cm AV Vmax:  181.00 cm/s AV Vmean:          124.000 cm/s AV VTI:            0.414 m AV Peak Grad:      13.1 mmHg AV Mean Grad:      7.0 mmHg LVOT Vmax:         127.00 cm/s LVOT Vmean:        83.800 cm/s LVOT VTI:          0.296 m LVOT/AV VTI ratio: 0.71 AI PHT:            559  msec  AORTA Ao Root diam: 2.60 cm MITRAL VALVE                TRICUSPID VALVE MV Area (PHT): 3.77 cm     TR Peak grad:   39.2 mmHg MV Decel Time: 201 msec     TR Vmax:        313.00 cm/s MV E velocity: 95.40 cm/s MV A velocity: 108.00 cm/s  SHUNTS MV E/A ratio:  0.88         Systemic VTI:  0.30 m                             Systemic Diam: 1.90 cm Ida Rogue MD Electronically signed by Ida Rogue MD Signature Date/Time: 04/16/2022/2:16:55 PM    Final    CT Head Wo Contrast  Result Date: 04/15/2022 CLINICAL DATA:  Altered level of consciousness, chest pain for 4 days, dizziness EXAM: CT HEAD WITHOUT CONTRAST TECHNIQUE: Contiguous axial images were obtained from the base of the skull through the vertex without intravenous contrast. RADIATION DOSE REDUCTION: This exam was performed according to the departmental dose-optimization program which includes automated exposure control, adjustment of the mA and/or kV according to patient size and/or use of iterative reconstruction technique. COMPARISON:  08/21/2021 FINDINGS: Brain: No acute infarct or hemorrhage. Lateral ventricles and midline structures are unremarkable. No acute extra-axial fluid collections. No mass effect. Vascular: No hyperdense vessel or unexpected calcification. Skull: Normal. Negative for fracture or focal lesion. Sinuses/Orbits: Mucosal thickening left maxillary sinus and left ethmoid air cells. Other: None. IMPRESSION: 1. No acute intracranial process. Electronically Signed   By: Randa Ngo M.D.   On: 04/15/2022 16:36   DG Chest 2 View  Result Date: 04/15/2022 CLINICAL DATA:  Chest pain, suspected sepsis, on antibiotics for UTI EXAM: CHEST - 2 VIEW COMPARISON:  04/15/2022 chest radiograph. FINDINGS: Stable cardiomediastinal silhouette with normal heart size. No pneumothorax. Trace bilateral pleural effusions with minimal bibasilar atelectasis. No pulmonary edema. IMPRESSION: Trace bilateral pleural effusions with minimal bibasilar  atelectasis. Electronically Signed   By: Ilona Sorrel M.D.   On: 04/15/2022 12:55    Microbiology: Results for orders placed or performed during the hospital encounter of 04/15/22  Culture, blood (Routine x 2)     Status: None   Collection Time: 04/15/22  1:07 PM   Specimen: BLOOD  Result Value Ref Range Status   Specimen Description BLOOD BLOOD RIGHT HAND  Final   Special Requests   Final    BOTTLES DRAWN AEROBIC AND ANAEROBIC Blood Culture adequate volume   Culture   Final    NO GROWTH 5 DAYS Performed at [facility], [address] [place], [place] [address]    Report Status 04/20/2022 FINAL  Final  Culture, blood (Routine x 2)     Status: None  Collection Time: 04/15/22  1:16 PM   Specimen: BLOOD  Result Value Ref Range Status   Specimen Description BLOOD BLOOD LEFT HAND  Final   Special Requests   Final    BOTTLES DRAWN AEROBIC AND ANAEROBIC Blood Culture results may not be optimal due to an inadequate volume of blood received in culture bottles   Culture   Final    NO GROWTH 5 DAYS Performed at [facility], [address], [place], [place] [address]    Report Status 04/20/2022 FINAL  Final  Urine Culture     Status: None   Collection Time: 04/15/22  2:33 PM   Specimen: Urine, Clean Catch  Result Value Ref Range Status   Specimen Description   Final    URINE, CLEAN CATCH Performed at [facility], [address] [place], [place] [address]    Special Requests   Final    NONE Performed at [facility], [address], [place], [place] [address]    Culture   Final    NO GROWTH Performed at [facility], [address], [place], [place] [address]    Report Status 04/17/2022 FINAL  Final    Labs: CBC: Recent Labs  Lab 04/17/22 0658 04/18/22 0453 04/19/22 0546 04/20/22 0450 04/21/22 0420  WBC 11.0* 10.6* 11.0* 11.4* 12.0*  HGB 8.0* 8.4* 8.4* 8.3* 8.0*  HCT 26.4* 27.8* 27.4* 27.7* 26.4*  MCV 76.7* 77.2* 77.4*  78.7* 77.0*  PLT 410* 408* 384 418* 399   Basic Metabolic Panel: Recent Labs  Lab 04/17/22 0658 04/18/22 0453 04/19/22 0546 04/20/22 0450 04/21/22 0420  NA 133* 132* 134* 129* 133*  K 3.8 3.5 3.6 3.9 4.2  CL 97* 95* 97* 93* 96*  CO2 29 27 28 29 27   GLUCOSE 131* 136* 138* 152* 147*  BUN 20 26* 23 25* 18  CREATININE 0.45 0.54 0.50 0.54 0.52  CALCIUM 9.2 9.1 9.3 9.0 9.1   Liver Function Tests: No results for input(s): "AST", "ALT", "ALKPHOS", "BILITOT", "PROT", "ALBUMIN" in the last 168 hours.  CBG: No results for input(s): "GLUCAP" in the last 168 hours.  Discharge time spent: less than 30 minutes.  Signed: , MD [facility] 04/22/2022

## 2022-04-22 NOTE — Hospital Course (Signed)
88 year old female with past medical history of subdural bleed and hypertension who presented to the emergency room on 10/5 after she presented with dizziness and intermittent chest pain.  In the emergency room, work-up unrevealing although did note to have acute diastolic CHF.  Started on IV Lasix and cardiology consulted.

## 2022-04-23 ENCOUNTER — Ambulatory Visit: Payer: PRIVATE HEALTH INSURANCE | Admitting: Family

## 2022-04-24 NOTE — Progress Notes (Unsigned)
   Patient ID: Mallory Oconnor, female    DOB: June 03, 1932, 88 y.o.   MRN: [id number]  HPI  [name] is a 88 y/o female with a history of  Echo report from 04/16/22 reviewed and showed an EF of 60-65% along with mild LVH, mildly elevated PA pressure of 44.2 mmHg and moderate MR.   Admitted 04/15/22 due to dizziness and chest pain. Found to be in acute HF. Cardiology consult obtained. Initially given IV lasix with transition to oral diuretics. Head CT negative and mental status back to baseline. Metoprolol stopped due to orthostasis. PT evaluation done. Discharged after 6 days.   She presents today for her initial visit with a chief complaint of   Review of Systems    Physical Exam  Assessment & Plan:  1: Chronic heart failure with preserved ejection fraction with structural changes (LVH)- - NYHA class  - BNP 04/15/22 was 499.5  2: HTN- - BP - BMP 04/21/22 reviewed and showed sodium 133, potassium 4.2, creatinine 0.52 and GFR >60  3: Anemia-

## 2022-04-26 ENCOUNTER — Encounter: Payer: Self-pay | Admitting: Family

## 2022-04-26 ENCOUNTER — Ambulatory Visit: Payer: Medicare Other | Attending: Family | Admitting: Family

## 2022-04-26 VITALS — BP 146/75 | HR 103 | Resp 16 | Ht 60.0 in | Wt 139.5 lb

## 2022-04-26 DIAGNOSIS — I5032 Chronic diastolic (congestive) heart failure: Secondary | ICD-10-CM | POA: Diagnosis not present

## 2022-04-26 DIAGNOSIS — G8929 Other chronic pain: Secondary | ICD-10-CM | POA: Insufficient documentation

## 2022-04-26 DIAGNOSIS — D649 Anemia, unspecified: Secondary | ICD-10-CM | POA: Diagnosis not present

## 2022-04-26 DIAGNOSIS — Z87891 Personal history of nicotine dependence: Secondary | ICD-10-CM | POA: Diagnosis not present

## 2022-04-26 DIAGNOSIS — R5383 Other fatigue: Secondary | ICD-10-CM | POA: Diagnosis present

## 2022-04-26 DIAGNOSIS — I110 Hypertensive heart disease with heart failure: Secondary | ICD-10-CM | POA: Diagnosis not present

## 2022-04-26 DIAGNOSIS — I10 Essential (primary) hypertension: Secondary | ICD-10-CM

## 2022-04-26 DIAGNOSIS — D509 Iron deficiency anemia, unspecified: Secondary | ICD-10-CM

## 2022-04-26 DIAGNOSIS — M549 Dorsalgia, unspecified: Secondary | ICD-10-CM | POA: Insufficient documentation

## 2022-04-26 NOTE — Patient Instructions (Addendum)
Continue weighing daily and call for an overnight weight gain of 3 pounds or more or a weekly weight gain of more than 5 pounds  If you have voicemail, please make sure your mailbox is cleaned out so that we may leave a message and please make sure to listen to any voicemails.    If you receive a satisfaction survey regarding the [facility], please take the time to fill it out. This way we can continue to provide excellent care and make any changes that need to be made.     

## 2022-04-27 ENCOUNTER — Telehealth: Payer: Self-pay

## 2022-04-27 NOTE — Telephone Encounter (Signed)
Attempted to contact patient to schedule a Palliative Care consult appointment. No answer left a message to return call.  

## 2022-04-30 ENCOUNTER — Telehealth: Payer: Self-pay

## 2022-04-30 NOTE — Telephone Encounter (Signed)
Attempted to contact patient/ patient's daughter Vaughan Basta to schedule a Palliative Care consult appointment. No answer left a message for Vaughan Basta to return call. Patient's voicemail is full.

## 2022-05-03 ENCOUNTER — Telehealth: Payer: Self-pay

## 2022-05-03 NOTE — Telephone Encounter (Signed)
Patient called to report a 3 pound weight gain over the last five days and looking for advice if medications need adjusting.  She denies any swelling, shortness of breath, or other symptoms. Provider notified. Patient advised no changes to medication at this time, but instructed to continue monitoring weight and to call if she continues to gain weight, has a 3 pound weight gain overnight, or begins to experience swelling, shortness of breath or other related symptoms. Patient verbalized understanding. Georg Ruddle, RN

## 2022-05-06 ENCOUNTER — Telehealth: Payer: Self-pay

## 2022-05-06 NOTE — Telephone Encounter (Signed)
Spoke with patient's daughter Vaughan Basta regarding Palliative Care services. She states she would like to discuss with patient and [facility]. Give her contact information to call back once she speaks with them.

## 2022-05-12 ENCOUNTER — Telehealth: Payer: Self-pay

## 2022-05-12 NOTE — Telephone Encounter (Signed)
Spoke with patient's daughter Vaughan Basta regarding Palliative Care services. She declined services at this time. She reports patient is newly established with [facility] and scheduled to be seen on 11/2. Will cancel referral and notify referring provider.

## 2022-05-16 NOTE — Progress Notes (Unsigned)
Patient ID: Mallory Oconnor, female    DOB: 1932-06-17, 88 y.o.   MRN: [id number]  HPI  [name] is a 88 y/o female with a history of HTN, anemia, subdural hematoma, previous tobacco use and chronic heart failure.   Echo report from 04/16/22 reviewed and showed an EF of 60-65% along with mild LVH, mildly elevated PA pressure of 44.2 mmHg and moderate MR.   Admitted 04/15/22 due to dizziness and chest pain. Found to be in acute HF. Cardiology consult obtained. Initially given IV lasix with transition to oral diuretics. Head CT negative and mental status back to baseline. Metoprolol stopped due to orthostasis. PT evaluation done. Discharged after 6 days.   She presents today for a follow-up visit with a chief complaint of minimal fatigue upon moderate exertion. Describes this as chronic in nature. She has associated chronic back pain along with this. She denies any difficulty sleeping, dizziness, abdominal distention, palpitations, pedal edema, chest pain, shortness of breath, cough or weight gain.   Currently has PT working with her.   Past Medical History:  Diagnosis Date   Anemia    CHF (congestive heart failure) ([facility])    Hypertension    Subdural hematoma (HCC)    No past surgical history on file. Family History  Problem Relation Age of Onset   Stroke Maternal Grandmother    Stroke Paternal Grandmother    Social History   Tobacco Use   Smoking status: Former    Types: Cigarettes   Smokeless tobacco: Never  Substance Use Topics   Alcohol use: Not Currently   No Known Allergies  Prior to Admission medications   Medication Sig Start Date End Date Taking? Authorizing Provider  ferrous gluconate (FERGON) 324 MG tablet Take 1 tablet (324 mg total) by mouth daily with breakfast. 04/21/22  Yes Annita Brod, MD  furosemide (LASIX) 20 MG tablet Take 1 tablet (20 mg total) by mouth daily. 04/22/22  Yes Annita Brod, MD  metoprolol tartrate (LOPRESSOR) 25 MG tablet Take  0.5 tablets (12.5 mg total) by mouth 2 (two) times daily. 04/21/22  Yes Annita Brod, MD  mirtazapine (REMERON) 7.5 MG tablet Take 7.5 mg by mouth at bedtime.   Yes [provider]  pantoprazole (PROTONIX) 40 MG tablet Take 40 mg by mouth daily.   Yes [provider]  spironolactone (ALDACTONE) 25 MG tablet Take 1 tablet (25 mg total) by mouth daily. 04/22/22  Yes Annita Brod, MD    Review of Systems  Constitutional:  Positive for fatigue. Negative for appetite change.  HENT:  Positive for hearing loss. Negative for congestion, postnasal drip and sore throat.   Eyes: Negative.   Respiratory:  Negative for cough, chest tightness and shortness of breath.   Cardiovascular:  Negative for chest pain, palpitations and leg swelling.  Gastrointestinal:  Negative for abdominal distention and abdominal pain.  Endocrine: Negative.   Genitourinary: Negative.   Musculoskeletal:  Positive for back pain. Negative for neck pain.  Skin: Negative.   Allergic/Immunologic: Negative.   Neurological:  Negative for dizziness and light-headedness.  Hematological:  Negative for adenopathy. Does not bruise/bleed easily.  Psychiatric/Behavioral:  Negative for dysphoric mood and sleep disturbance (sleeping on 1-2 pillows). The patient is not nervous/anxious.    Vitals:   05/17/22 1338  BP: 138/66  Pulse: 95  Resp: 16  SpO2: 100%  Weight: 140 lb 8 oz (63.7 kg)  Height: 5' (1.524 m)   Wt Readings from Last 3  Encounters:  05/17/22 140 lb 8 oz (63.7 kg)  04/26/22 139 lb 8 oz (63.3 kg)  04/21/22 133 lb 13.1 oz (60.7 kg)   Lab Results  Component Value Date   CREATININE 0.52 04/21/2022   CREATININE 0.54 04/20/2022   CREATININE 0.50 04/19/2022   Physical Exam Vitals and nursing note reviewed. Exam conducted with a chaperone present (daughter).  Constitutional:      Appearance: Normal appearance.  HENT:     Head: Normocephalic and atraumatic.     Right Ear: Decreased hearing  noted.     Left Ear: Decreased hearing noted.  Cardiovascular:     Rate and Rhythm: Normal rate and regular rhythm.  Pulmonary:     Effort: Pulmonary effort is normal.     Breath sounds: No wheezing or rales.  Abdominal:     General: There is no distension.     Palpations: Abdomen is soft.     Tenderness: There is no abdominal tenderness.  Musculoskeletal:        General: No tenderness.     Cervical back: Normal range of motion and neck supple.     Right lower leg: No edema.     Left lower leg: No edema.  Skin:    General: Skin is warm and dry.  Neurological:     General: No focal deficit present.     Mental Status: She is alert and oriented to person, place, and time.  Psychiatric:        Mood and Affect: Mood normal.        Behavior: Behavior normal.        Thought Content: Thought content normal.   Assessment & Plan:  1: Chronic heart failure with preserved ejection fraction with structural changes (LVH)- - NYHA class II - euvolemic today - weighing daily; reminded to call for an overnight weight gain of > 2 pounds or a weekly weight gain of > 5 pounds - weight up 1 pound from last visit here 3 weeks ago - not adding salt to her food - uses her walker for stability; currently has PT working with her - BNP 04/15/22 was 499.5 - consider adding entresto or SGLT2 if able  2: HTN- - BP looks good today (138/66) - sees PCP Mallory Oconnor  - BMP 04/21/22 reviewed and showed sodium 133, potassium 4.2, creatinine 0.52 and GFR >60  3: Anemia- - hemoglobin on 04/21/22 was 8.0 - currently taking fergon   Medication bottles reviewed.   Return in 4 months, sooner if needed.

## 2022-05-17 ENCOUNTER — Encounter: Payer: Self-pay | Admitting: Family

## 2022-05-17 ENCOUNTER — Ambulatory Visit: Payer: Medicare Other | Attending: Family | Admitting: Family

## 2022-05-17 VITALS — BP 138/66 | HR 95 | Resp 16 | Ht 60.0 in | Wt 140.5 lb

## 2022-05-17 DIAGNOSIS — R079 Chest pain, unspecified: Secondary | ICD-10-CM | POA: Diagnosis not present

## 2022-05-17 DIAGNOSIS — R42 Dizziness and giddiness: Secondary | ICD-10-CM | POA: Diagnosis not present

## 2022-05-17 DIAGNOSIS — D649 Anemia, unspecified: Secondary | ICD-10-CM | POA: Insufficient documentation

## 2022-05-17 DIAGNOSIS — D509 Iron deficiency anemia, unspecified: Secondary | ICD-10-CM

## 2022-05-17 DIAGNOSIS — Z87891 Personal history of nicotine dependence: Secondary | ICD-10-CM | POA: Insufficient documentation

## 2022-05-17 DIAGNOSIS — I110 Hypertensive heart disease with heart failure: Secondary | ICD-10-CM | POA: Insufficient documentation

## 2022-05-17 DIAGNOSIS — I10 Essential (primary) hypertension: Secondary | ICD-10-CM | POA: Diagnosis not present

## 2022-05-17 DIAGNOSIS — I5032 Chronic diastolic (congestive) heart failure: Secondary | ICD-10-CM | POA: Diagnosis present

## 2022-05-17 NOTE — Patient Instructions (Signed)
Continue weighing daily and call for an overnight weight gain of 3 pounds or more or a weekly weight gain of more than 5 pounds.   If you have voicemail, please make sure your mailbox is cleaned out so that we may leave a message and please make sure to listen to any voicemails.     

## 2022-05-24 ENCOUNTER — Encounter: Payer: Self-pay | Admitting: Family Medicine

## 2022-08-19 LAB — LAB REPORT - SCANNED: EGFR: 74.9

## 2022-09-14 ENCOUNTER — Ambulatory Visit: Payer: Medicare Other | Attending: Family | Admitting: Family

## 2022-09-14 ENCOUNTER — Encounter: Payer: Self-pay | Admitting: Family

## 2022-09-14 VITALS — BP 140/60 | HR 98 | Wt 145.0 lb

## 2022-09-14 DIAGNOSIS — D509 Iron deficiency anemia, unspecified: Secondary | ICD-10-CM

## 2022-09-14 DIAGNOSIS — D649 Anemia, unspecified: Secondary | ICD-10-CM | POA: Diagnosis not present

## 2022-09-14 DIAGNOSIS — I10 Essential (primary) hypertension: Secondary | ICD-10-CM

## 2022-09-14 DIAGNOSIS — R5383 Other fatigue: Secondary | ICD-10-CM | POA: Insufficient documentation

## 2022-09-14 DIAGNOSIS — Z7984 Long term (current) use of oral hypoglycemic drugs: Secondary | ICD-10-CM | POA: Insufficient documentation

## 2022-09-14 DIAGNOSIS — R0602 Shortness of breath: Secondary | ICD-10-CM | POA: Insufficient documentation

## 2022-09-14 DIAGNOSIS — I5032 Chronic diastolic (congestive) heart failure: Secondary | ICD-10-CM | POA: Diagnosis present

## 2022-09-14 DIAGNOSIS — I110 Hypertensive heart disease with heart failure: Secondary | ICD-10-CM | POA: Insufficient documentation

## 2022-09-14 DIAGNOSIS — Z79899 Other long term (current) drug therapy: Secondary | ICD-10-CM | POA: Diagnosis not present

## 2022-09-14 MED ORDER — DAPAGLIFLOZIN PROPANEDIOL 10 MG PO TABS: 10.0000 mg | ORAL_TABLET | Freq: Every day | ORAL | 5 refills | Status: DC

## 2022-09-14 NOTE — Patient Instructions (Signed)
Begin taking farxiga as 1 tablet every day

## 2022-09-14 NOTE — Progress Notes (Signed)
Patient ID: Mallory Oconnor, female    DOB: 1932-01-10, 89 y.o.   MRN: [id number]  HPI  [name] is a 89 y/o female with a history of HTN, anemia, subdural hematoma, previous tobacco use and chronic heart failure.   Echo report from 04/16/22 reviewed and showed an EF of 60-65% along with mild LVH, mildly elevated PA pressure of 44.2 mmHg and moderate MR.   Admitted 04/15/22 due to dizziness and chest pain. Found to be in acute HF. Cardiology consult obtained. Initially given IV lasix with transition to oral diuretics. Head CT negative and mental status back to baseline. Metoprolol stopped due to orthostasis. PT evaluation done. Discharged after 6 days.   She presents today for a HF follow-up visit with a chief complaint of moderate fatigue with minimal exertion. Describes this as chronic in nature. Has associated SOB & back pain along with this. Denies any difficulty sleeping, dizziness, chest tightness, cough, chest pain, pedal edema, palpitations, abdominal distention or weight gain.   Weight at home has ranged from 139-142 pounds  Past Medical History:  Diagnosis Date   Anemia    CHF (congestive heart failure) (HCC)    Hypertension    Subdural hematoma (HCC)    No past surgical history on file. Family History  Problem Relation Age of Onset   Stroke Maternal Grandmother    Stroke Paternal Grandmother    Social History   Tobacco Use   Smoking status: Former    Types: Cigarettes   Smokeless tobacco: Never  Substance Use Topics   Alcohol use: Not Currently   No Known Allergies  Prior to Admission medications   Medication Sig Start Date End Date Taking? Authorizing Provider  ferrous gluconate (FERGON) 324 MG tablet Take 1 tablet (324 mg total) by mouth daily with breakfast. 04/21/22  Yes Annita Brod, MD  furosemide (LASIX) 20 MG tablet Take 1 tablet (20 mg total) by mouth daily. 04/22/22  Yes Annita Brod, MD  metoprolol tartrate (LOPRESSOR) 25 MG tablet Take 0.5  tablets (12.5 mg total) by mouth 2 (two) times daily. 04/21/22  Yes Annita Brod, MD  mirtazapine (REMERON) 7.5 MG tablet Take 7.5 mg by mouth at bedtime.   Yes [provider]  pantoprazole (PROTONIX) 40 MG tablet Take 40 mg by mouth daily.   Yes [provider]  spironolactone (ALDACTONE) 25 MG tablet Take 1 tablet (25 mg total) by mouth daily. 04/22/22  Yes Annita Brod, MD   Review of Systems  Constitutional:  Positive for fatigue (easily). Negative for appetite change.  HENT:  Positive for hearing loss. Negative for congestion, postnasal drip and sore throat.   Eyes: Negative.   Respiratory:  Positive for shortness of breath. Negative for cough and chest tightness.   Cardiovascular:  Negative for chest pain, palpitations and leg swelling.  Gastrointestinal:  Negative for abdominal distention and abdominal pain.  Endocrine: Negative.   Genitourinary: Negative.   Musculoskeletal:  Positive for back pain. Negative for neck pain.  Skin: Negative.   Allergic/Immunologic: Negative.   Neurological:  Negative for dizziness and light-headedness.  Hematological:  Negative for adenopathy. Does not bruise/bleed easily.  Psychiatric/Behavioral:  Negative for dysphoric mood and sleep disturbance (sleeping on 1-2 pillows). The patient is not nervous/anxious.    Vitals:   09/14/22 1436  BP: (!) 140/60  Pulse: 98  SpO2: 96%  Weight: 145 lb (65.8 kg)   Wt Readings from Last 3 Encounters:  09/14/22 145 lb (65.8 kg)  05/17/22 140 lb 8 oz (63.7 kg)  04/26/22 139 lb 8 oz (63.3 kg)   Lab Results  Component Value Date   CREATININE 0.52 04/21/2022   CREATININE 0.54 04/20/2022   CREATININE 0.50 04/19/2022   Physical Exam Vitals and nursing note reviewed. Exam conducted with a chaperone present (daughter).  Constitutional:      Appearance: Normal appearance.  HENT:     Head: Normocephalic and atraumatic.     Right Ear: Decreased hearing noted.     Left Ear:  Decreased hearing noted.  Cardiovascular:     Rate and Rhythm: Normal rate and regular rhythm.  Pulmonary:     Effort: Pulmonary effort is normal.     Breath sounds: No wheezing or rales.  Abdominal:     General: There is no distension.     Palpations: Abdomen is soft.     Tenderness: There is no abdominal tenderness.  Musculoskeletal:        General: No tenderness.     Cervical back: Normal range of motion and neck supple.     Right lower leg: No edema.     Left lower leg: No edema.  Skin:    General: Skin is warm and dry.  Neurological:     General: No focal deficit present.     Mental Status: She is alert and oriented to person, place, and time.  Psychiatric:        Mood and Affect: Mood normal.        Behavior: Behavior normal.        Thought Content: Thought content normal.   Assessment & Plan:  1: Chronic heart failure with preserved ejection fraction with LVH- - NYHA class III - euvolemic today - weighing daily; reminded to call for an overnight weight gain of > 2 pounds or a weekly weight gain of > 5 pounds - weight up 5 pounds from last visit here 4 months ago - not adding salt to her food - uses her walker for stability; currently has PT working with her - add farxiga '10mg'$  daily; 30 day voucher given - BMP next visit - discussed adding entresto next visit - BNP 04/15/22 was 499.5  2: HTN- - BP 140/60 - sees PCP Belenda Cruise) at [facility] 04/21/22 reviewed and showed sodium 133, potassium 4.2, creatinine 0.52 and GFR >60  3: Anemia- - hemoglobin on 04/21/22 was 8.0 - currently taking fergon   Medication bottles reviewed.   Return in 3 weeks, sooner if needed

## 2022-09-21 ENCOUNTER — Telehealth: Payer: Self-pay | Admitting: *Deleted

## 2022-09-21 NOTE — Telephone Encounter (Signed)
Spoke with pts daughter she is aware.

## 2022-09-21 NOTE — Telephone Encounter (Signed)
Pts dughter called stautng pts physical therapist called her to report pt is pale, weak, and dizzy. The daughter said pts bp is fine ( I asked for exact numbers and she did not know). She asked if Wilder Glade might be the cause.   Routed to [employer] for advice

## 2022-10-07 ENCOUNTER — Encounter: Payer: PRIVATE HEALTH INSURANCE | Admitting: Family

## 2022-10-13 ENCOUNTER — Encounter: Payer: Self-pay | Admitting: Family

## 2022-10-13 ENCOUNTER — Ambulatory Visit: Payer: Medicare Other | Attending: Family | Admitting: Family

## 2022-10-13 VITALS — BP 122/69 | HR 96 | Wt 143.1 lb

## 2022-10-13 DIAGNOSIS — D649 Anemia, unspecified: Secondary | ICD-10-CM | POA: Insufficient documentation

## 2022-10-13 DIAGNOSIS — Z79899 Other long term (current) drug therapy: Secondary | ICD-10-CM | POA: Diagnosis not present

## 2022-10-13 DIAGNOSIS — D509 Iron deficiency anemia, unspecified: Secondary | ICD-10-CM

## 2022-10-13 DIAGNOSIS — I110 Hypertensive heart disease with heart failure: Secondary | ICD-10-CM | POA: Insufficient documentation

## 2022-10-13 DIAGNOSIS — I10 Essential (primary) hypertension: Secondary | ICD-10-CM | POA: Diagnosis not present

## 2022-10-13 DIAGNOSIS — R5383 Other fatigue: Secondary | ICD-10-CM | POA: Insufficient documentation

## 2022-10-13 DIAGNOSIS — R0602 Shortness of breath: Secondary | ICD-10-CM | POA: Insufficient documentation

## 2022-10-13 DIAGNOSIS — Z87891 Personal history of nicotine dependence: Secondary | ICD-10-CM | POA: Insufficient documentation

## 2022-10-13 DIAGNOSIS — I5032 Chronic diastolic (congestive) heart failure: Secondary | ICD-10-CM

## 2022-10-13 DIAGNOSIS — M549 Dorsalgia, unspecified: Secondary | ICD-10-CM | POA: Insufficient documentation

## 2022-10-13 DIAGNOSIS — Z7984 Long term (current) use of oral hypoglycemic drugs: Secondary | ICD-10-CM | POA: Insufficient documentation

## 2022-10-13 NOTE — Progress Notes (Signed)
Patient ID: Mallory Oconnor, female    DOB: 1932-02-16, 89 y.o.   MRN: [id number]  HPI  Mallory Oconnor is a 89 y/o female with a history of HTN, anemia, subdural hematoma, previous tobacco use and chronic heart failure.   Echo 04/16/22: EF of 60-65% along with mild LVH, mildly elevated PA pressure of 44.2 mmHg and moderate MR.   Admitted 04/15/22 due to dizziness and chest pain. Found to be in acute HF. Cardiology consult obtained. Initially given IV lasix with transition to oral diuretics. Head CT negative and mental status back to baseline. Metoprolol stopped due to orthostasis. PT evaluation done. Discharged after 6 days.   She presents today for a HF follow-up visit with a chief complaint of moderate fatigue with minimal exertion. Chronic in nature although she does report feeling better since starting the farxiga. Has associated SOB and back pain along with this. Denies difficulty sleeping, dizziness, abdominal distention, palpitations, pedal edema, chest pain, cough or weight gain.   Tolerating farxiga and didn't have to hold it. She does report feeling better since starting it and even went outside in the sun to walk with PT.   Past Medical History:  Diagnosis Date   Anemia    CHF (congestive heart failure) ([place])    Hypertension    Subdural hematoma (HCC)    No past surgical history on file. Family History  Problem Relation Age of Onset   Stroke Maternal Grandmother    Stroke Paternal Grandmother    Social History   Tobacco Use   Smoking status: Former    Types: Cigarettes   Smokeless tobacco: Never  Substance Use Topics   Alcohol use: Not Currently   No Known Allergies  Prior to Admission medications   Medication Sig Start Date End Date Taking? Authorizing Provider  dapagliflozin propanediol (FARXIGA) 10 MG TABS tablet Take 1 tablet (10 mg total) by mouth daily before breakfast. 09/14/22   Alisa Graff, FNP  ferrous gluconate (FERGON) 324 MG tablet Take 1 tablet (324 mg  total) by mouth daily with breakfast. 04/21/22   Annita Brod, MD  furosemide (LASIX) 20 MG tablet Take 1 tablet (20 mg total) by mouth daily. 04/22/22   Annita Brod, MD  metoprolol tartrate (LOPRESSOR) 25 MG tablet Take 0.5 tablets (12.5 mg total) by mouth 2 (two) times daily. 04/21/22   Annita Brod, MD  mirtazapine (REMERON) 7.5 MG tablet Take 7.5 mg by mouth at bedtime.    [provider]  pantoprazole (PROTONIX) 40 MG tablet Take 40 mg by mouth daily.    [provider]  spironolactone (ALDACTONE) 25 MG tablet Take 1 tablet (25 mg total) by mouth daily. 04/22/22   Annita Brod, MD    Review of Systems  Constitutional:  Positive for fatigue (easily). Negative for appetite change.  HENT:  Positive for hearing loss. Negative for congestion, postnasal drip and sore throat.   Eyes: Negative.   Respiratory:  Positive for shortness of breath. Negative for cough and chest tightness.   Cardiovascular:  Negative for chest pain, palpitations and leg swelling.  Gastrointestinal:  Negative for abdominal distention and abdominal pain.  Endocrine: Negative.   Genitourinary: Negative.   Musculoskeletal:  Positive for back pain. Negative for neck pain.  Skin: Negative.   Allergic/Immunologic: Negative.   Neurological:  Negative for dizziness and light-headedness.  Hematological:  Negative for adenopathy. Does not bruise/bleed easily.  Psychiatric/Behavioral:  Negative for dysphoric mood and sleep disturbance (sleeping on  1-2 pillows). The patient is not nervous/anxious.    Vitals:   10/13/22 1433  BP: 122/69  Pulse: 96  SpO2: 98%  Weight: 143 lb 2 oz (64.9 kg)   Wt Readings from Last 3 Encounters:  10/13/22 143 lb 2 oz (64.9 kg)  09/14/22 145 lb (65.8 kg)  05/17/22 140 lb 8 oz (63.7 kg)   Lab Results  Component Value Date   CREATININE 0.52 04/21/2022   CREATININE 0.54 04/20/2022   CREATININE 0.50 04/19/2022   Physical Exam Vitals and nursing  note reviewed. Exam conducted with a chaperone present (daughter).  Constitutional:      Appearance: Normal appearance.  HENT:     Head: Normocephalic and atraumatic.     Right Ear: Decreased hearing noted.     Left Ear: Decreased hearing noted.  Cardiovascular:     Rate and Rhythm: Normal rate and regular rhythm.  Pulmonary:     Effort: Pulmonary effort is normal.     Breath sounds: No wheezing or rales.  Abdominal:     General: There is no distension.     Palpations: Abdomen is soft.     Tenderness: There is no abdominal tenderness.  Musculoskeletal:        General: No tenderness.     Cervical back: Normal range of motion and neck supple.     Right lower leg: No edema.     Left lower leg: No edema.  Skin:    General: Skin is warm and dry.  Neurological:     General: No focal deficit present.     Mental Status: She is alert and oriented to person, place, and time.  Psychiatric:        Mood and Affect: Mood normal.        Behavior: Behavior normal.        Thought Content: Thought content normal.   Assessment & Plan:  1: Chronic heart failure with preserved ejection fraction with LVH- - NYHA class III - euvolemic today - weighing daily; reminded to call for an overnight weight gain of > 2 pounds or a weekly weight gain of > 5 pounds - weight down 2 pounds from last visit here 1 month ago - not adding salt to her food - uses her walker for stability; currently has PT working with her - continue farxiga 10mg  daily - continue furosemide 20mg  daily - continue spironolactone 25mg  daily - order written for BMP to be drawn at ALF and results faxed to [place] - discussed adding entresto but she is concerned about the cost; will apply for a grant and if she qualifies, then farxiga will be covered and then will start entresto - BNP 04/15/22 was 499.5  2: HTN- - BP 122/69 - sees PCP Belenda Cruise) at [facility]  - BMP 04/21/22 reviewed and showed sodium 133, potassium 4.2, creatinine 0.52  and GFR >60  3: Anemia- - hemoglobin on 04/21/22 was 8.0 - currently taking fergon   Return in 1 month, sooner if needed.

## 2022-10-14 ENCOUNTER — Other Ambulatory Visit: Payer: Self-pay | Admitting: Family

## 2022-10-14 ENCOUNTER — Telehealth: Payer: Self-pay | Admitting: Pharmacist

## 2022-10-14 ENCOUNTER — Other Ambulatory Visit: Payer: Self-pay

## 2022-10-14 ENCOUNTER — Other Ambulatory Visit (HOSPITAL_COMMUNITY): Payer: Self-pay

## 2022-10-14 MED ORDER — SACUBITRIL-VALSARTAN 24-26 MG PO TABS
1.0000 | ORAL_TABLET | Freq: Two times a day (BID) | ORAL | 3 refills | Status: DC
  Filled 2022-10-14: qty 60, 30d supply, fill #0

## 2022-10-14 MED ORDER — SPIRONOLACTONE 25 MG PO TABS
25.0000 mg | ORAL_TABLET | Freq: Every day | ORAL | 3 refills | Status: DC
  Filled 2022-10-14: qty 90, 90d supply, fill #0

## 2022-10-14 MED ORDER — METOPROLOL TARTRATE 25 MG PO TABS
12.5000 mg | ORAL_TABLET | Freq: Two times a day (BID) | ORAL | 3 refills | Status: DC
  Filled 2022-10-14: qty 90, 90d supply, fill #0
  Filled 2023-02-22 – 2023-03-23 (×2): qty 90, 90d supply, fill #1

## 2022-10-14 MED ORDER — FUROSEMIDE 20 MG PO TABS
20.0000 mg | ORAL_TABLET | Freq: Every day | ORAL | 3 refills | Status: DC
  Filled 2022-10-14: qty 90, 90d supply, fill #0
  Filled 2023-03-23: qty 90, 90d supply, fill #1

## 2022-10-14 MED ORDER — EMPAGLIFLOZIN 10 MG PO TABS
10.0000 mg | ORAL_TABLET | Freq: Every day | ORAL | 5 refills | Status: DC
  Filled 2022-10-14: qty 30, 30d supply, fill #0

## 2022-10-14 NOTE — Progress Notes (Signed)
Will be getting patient assistance at the [facility] so will need to change farxiga to jardiance. Will also start entresto 24/26mg  BID. All cardiac meds were sent to the pharmacy.

## 2022-10-14 NOTE — Telephone Encounter (Signed)
Patient has medicare part A/B but no medication coverage.  Patient and daughter agreed ton register with medication management. Med Management will process PAP for Jardiance and Entresto as well. Will use 30 day free cards for brand name drugs until paperwork completed.

## 2022-10-15 ENCOUNTER — Other Ambulatory Visit: Payer: Self-pay

## 2022-10-22 ENCOUNTER — Telehealth: Payer: Self-pay

## 2022-10-22 NOTE — Telephone Encounter (Signed)
Returned pt's daughter's call. She states she received a 30 day supply of jardiance and entresto from [employer] and she didn't know if she should start either one yet, and if the jardiance replaces the farxiga. She states that she paid $400 for their current bottle of farxiga and would like to finish that bottle before beginning jardiance.  Mallory Kindred, FNP notified.  Informed pt's daughter, [name], that pt should start the entresto medication now and that the jardiance will replace the farxiga. Pt should stop taking the farxiga when she starts the [place], per [employer].. [name] had no further questions or concerns.

## 2022-11-10 ENCOUNTER — Other Ambulatory Visit: Payer: Self-pay

## 2022-11-11 ENCOUNTER — Other Ambulatory Visit: Payer: Self-pay

## 2022-11-12 ENCOUNTER — Ambulatory Visit: Payer: Medicare Other | Attending: Family | Admitting: Family

## 2022-11-12 ENCOUNTER — Encounter: Payer: Self-pay | Admitting: Family

## 2022-11-12 ENCOUNTER — Other Ambulatory Visit: Payer: Self-pay

## 2022-11-12 VITALS — BP 132/64 | HR 100 | Wt 140.8 lb

## 2022-11-12 DIAGNOSIS — I5032 Chronic diastolic (congestive) heart failure: Secondary | ICD-10-CM | POA: Diagnosis not present

## 2022-11-12 DIAGNOSIS — D649 Anemia, unspecified: Secondary | ICD-10-CM | POA: Insufficient documentation

## 2022-11-12 DIAGNOSIS — I110 Hypertensive heart disease with heart failure: Secondary | ICD-10-CM | POA: Insufficient documentation

## 2022-11-12 DIAGNOSIS — D509 Iron deficiency anemia, unspecified: Secondary | ICD-10-CM

## 2022-11-12 DIAGNOSIS — Z79899 Other long term (current) drug therapy: Secondary | ICD-10-CM | POA: Insufficient documentation

## 2022-11-12 DIAGNOSIS — I10 Essential (primary) hypertension: Secondary | ICD-10-CM | POA: Diagnosis not present

## 2022-11-12 DIAGNOSIS — Z87891 Personal history of nicotine dependence: Secondary | ICD-10-CM | POA: Insufficient documentation

## 2022-11-12 NOTE — Progress Notes (Signed)
[facility] HEART FAILURE CLINIC - Pharmacist Education Note  Assessment Denecia S Guinea-Bissau is a 89 y.o. female with HFpEF (EF >50%) presenting to the Heart Failure Clinic for follow up. Patient presents today with her daughter. Daughter manages her medications. She reports no issues with adherence. She weighs herself daily and reports that her weights are relatively stable. She reports no signs or symptoms of volume overload. She lives in independent living facility where an aid helps with daily administration of medications.  She and her daughter are working with [facility] community pharmacy to set up patient assistance. They will go today to fill out paperwork. Patient was out of Entresto and per [facility] outpatient pharmacist, it may take 6-8 weeks for her to get it. Samples provided in clinic today.   Recent ED Visit (past 6 months): none  Guideline-Directed Medical Therapy/Evidence Based Medicine ACE/ARB/ARNI: Sacubitril/valsartan 24/26 mg BID Beta Blocker: Metoprolol tartrate 12.5 mg twice daily Aldosterone Antagonist: Spironolactone 25 mg daily Diuretic: Furosemide 20 mg daily SGLT2i: Empagliflozin 10 mg daily  Adherence Assessment Do you ever forget to take your medication? [] Yes [x] No  Do you ever skip doses due to side effects? [] Yes [x] No  Do you have trouble affording your medicines? [] Yes [x] No  Are you ever unable to pick up your medication due to transportation difficulties? [] Yes [x] No  Do you ever stop taking your medications because you don't believe they are helping? [] Yes [x] No  Do you check your weight daily? [x] Yes [] No  Adherence strategy: pill box Barriers to obtaining medications: affordability, working with [facility] outpatient pharmacy   Diagnostics ECHO: Date 04/16/2022, EF 60-65%, no RWMA, mild LVH, G1DD  Vitals    10/13/2022    2:33 PM 09/14/2022    2:36 PM 05/17/2022    1:38 PM  Vitals with BMI  Height   5\' 0"   Weight 143 lbs 2 oz 145 lbs 140 lbs 8 oz  BMI   27.44   Systolic 122 140 161  Diastolic 69 60 66  Pulse 96 98 95    Recent Labs    Latest Ref Rng & Units 04/21/2022    4:20 AM 04/20/2022    4:50 AM 04/19/2022    5:46 AM  BMP  Glucose 70 - 99 mg/dL 096  045  409   BUN 8 - 23 mg/dL 18  25  23    Creatinine 0.44 - 1.00 mg/dL 8.11  9.14  7.82   Sodium 135 - 145 mmol/L 133  129  134   Potassium 3.5 - 5.1 mmol/L 4.2  3.9  3.6   Chloride 98 - 111 mmol/L 96  93  97   CO2 22 - 32 mmol/L 27  29  28    Calcium 8.9 - 10.3 mg/dL 9.1  9.0  9.3    Past Medical History Past Medical History:  Diagnosis Date   Anemia    CHF (congestive heart failure) (HCC)    Hypertension    Subdural hematoma (HCC)     Plan CHF Continue regimen as directed by NP Continue daily weights Follow up with outpatient pharmacy for medication assistance   Time spent: 15 minutes  Celene Squibb, PharmD PGY1 Pharmacy Resident 11/12/2022 2:14 PM

## 2022-11-12 NOTE — Progress Notes (Signed)
Advanced Heart Failure Clinic Note   PCP: Melonie Florida, NP @ ALF  Primary Cardiologist: None  HPI:  Ms Guinea-Bissau is a 89 y/o female with a history of HTN, anemia, subdural hematoma, previous tobacco use and chronic heart failure.   Echo 04/16/22: EF of 60-65% along with mild LVH, mildly elevated PA pressure of 44.2 mmHg and moderate MR.   Admitted 04/15/22 due to dizziness and chest pain. Found to be in acute HF. Cardiology consult obtained. Initially given IV lasix with transition to oral diuretics. Head CT negative and mental status back to baseline. Metoprolol stopped due to orthostasis. PT evaluation done. Discharged after 6 days.   She presents today for a HF follow-up visit with a chief complaint of moderate fatigue with little exertion. Chronic in nature. Has SOB with long distances, dizziness and difficulty sleeping due to neuropathy in her feet along with this. Her daughter is going to [facility] today to turn in all her paperwork but may need entresto samples while going through the approval process. Daughter says that patient is going on more outings and seems more engaged than previously.   ROS: All systems negative except as listed in HPI, PMH and Problem List.  SH:  Social History   Socioeconomic History   Marital status: Married    Spouse name: Not on file   Number of children: Not on file   Years of education: Not on file   Highest education level: Not on file  Occupational History   Not on file  Tobacco Use   Smoking status: Former    Types: Cigarettes   Smokeless tobacco: Never  Substance and Sexual Activity   Alcohol use: Not Currently   Drug use: Never   Sexual activity: Not Currently  Other Topics Concern   Not on file  Social History Narrative   ** Merged History Encounter **       Social Determinants of Health   Financial Resource Strain: Not on file  Food Insecurity: No Food Insecurity (04/16/2022)   Hunger Vital Sign     Worried About Running Out of Food in the Last Year: Never true    Ran Out of Food in the Last Year: Never true  Transportation Needs: No Transportation Needs (04/16/2022)   PRAPARE - Administrator, Civil Service (Medical): No    Lack of Transportation (Non-Medical): No  Physical Activity: Not on file  Stress: Not on file  Social Connections: Not on file  Intimate Partner Violence: Patient Declined (04/16/2022)   Humiliation, Afraid, Rape, and Kick questionnaire    Fear of Current or Ex-Partner: Patient declined    Emotionally Abused: Patient declined    Physically Abused: Patient declined    Sexually Abused: Patient declined    FH:  Family History  Problem Relation Age of Onset   Stroke Maternal Grandmother    Stroke Paternal Grandmother     Past Medical History:  Diagnosis Date   Anemia    CHF (congestive heart failure) (HCC)    Hypertension    Subdural hematoma (HCC)    Prior to Admission medications   Medication Sig Start Date End Date Taking? Authorizing Provider  empagliflozin (JARDIANCE) 10 MG TABS tablet Take 1 tablet (10 mg total) by mouth daily before breakfast. 10/14/22  Yes Delma Freeze, FNP  ferrous gluconate (FERGON) 324 MG tablet Take 1 tablet (324 mg total) by mouth daily with breakfast. 04/21/22  Yes Virginia Rochester  K, MD  furosemide (LASIX) 20 MG tablet Take 1 tablet (20 mg total) by mouth daily. 10/14/22  Yes Clarisa Kindred A, FNP  metoprolol tartrate (LOPRESSOR) 25 MG tablet Take 0.5 tablets (12.5 mg total) by mouth 2 (two) times daily. 10/14/22  Yes Abaigeal Moomaw, Inetta Fermo A, FNP  mirtazapine (REMERON) 7.5 MG tablet Take 7.5 mg by mouth at bedtime.   Yes [provider]  pantoprazole (PROTONIX) 40 MG tablet Take 40 mg by mouth daily.   Yes [provider]  sacubitril-valsartan (ENTRESTO) 24-26 MG Take 1 tablet by mouth 2 (two) times daily. 10/14/22  Yes Clarisa Kindred A, FNP  spironolactone (ALDACTONE) 25 MG tablet Take 1 tablet (25 mg  total) by mouth daily. 10/14/22  Yes Clarisa Kindred A, FNP   Vitals:   11/12/22 1416  BP: 132/64  Pulse: 100  SpO2: 98%  Weight: 140 lb 12.8 oz (63.9 kg)   Wt Readings from Last 3 Encounters:  11/12/22 140 lb 12.8 oz (63.9 kg)  10/13/22 143 lb 2 oz (64.9 kg)  09/14/22 145 lb (65.8 kg)   Lab Results  Component Value Date   CREATININE 0.52 04/21/2022   CREATININE 0.54 04/20/2022   CREATININE 0.50 04/19/2022   PHYSICAL EXAM:  General:  Well appearing. No resp difficulty HEENT: normal Neck: supple. JVP flat. No lymphadenopathy or thryomegaly appreciated. Cor: PMI normal. Regular rate & rhythm. No rubs, gallops or murmurs. Lungs: clear Abdomen: soft, nontender, nondistended. No hepatosplenomegaly. No bruits or masses.  Extremities: no cyanosis, clubbing, rash, edema Neuro: alert & oriented x3, cranial nerves grossly intact. Moves all 4 extremities w/o difficulty. Affect pleasant.   ASSESSMENT & PLAN:  1: Chronic heart failure with preserved ejection fraction with LVH- - NYHA class III - euvolemic today - weighing daily; reminded to call for an overnight weight gain of > 2 pounds or a weekly weight gain of > 5 pounds - weight down 3 pounds from last visit here 1 month ago - etiology likely HTN - Echo 04/16/22: EF of 60-65% along with mild LVH, mildly elevated PA pressure of 44.2 mmHg and moderate MR.  - not adding salt to her food - uses her walker for stability; currently has PT working with her - continue jardiance 10mg  daily - continue furosemide 20mg  daily - continue entresto 24/26mg  BID; samples provided today by PharmD; could consider increasing entresto in future being mindful of BP in someone who is 89 y/o and uses a walker - continue spironolactone 25mg  daily - BNP 04/15/22 was 499.5 - PharmD reconciled meds w/ patient  2: HTN- - BP 132/64 - sees PCP [name] at [facility] - BMP 08/19/22 reviewed and showed sodium 134, potassium 4.7, creatinine 0.76 and GFR  74.85  3: Anemia- - hemoglobin on 11/13/22 was 9.0 - currently taking fergon  Return in 3 months, sooner if needed.

## 2022-11-22 ENCOUNTER — Other Ambulatory Visit: Payer: Self-pay

## 2022-11-23 NOTE — Progress Notes (Signed)
[facility] Heart Failure Clinic received missing information request from [employer] on 11/15/2022 for proof of income and household size. Message relayed to patient advocate who is actively working on patient's application. She called the number on file and left a voicemail with her direct call-back number.

## 2022-11-24 ENCOUNTER — Other Ambulatory Visit: Payer: Self-pay

## 2022-11-26 ENCOUNTER — Other Ambulatory Visit: Payer: Self-pay

## 2022-11-26 MED ORDER — MIRTAZAPINE 7.5 MG PO TABS
7.5000 mg | ORAL_TABLET | Freq: Every day | ORAL | 4 refills | Status: DC
  Filled 2022-11-26: qty 90, 90d supply, fill #0

## 2022-11-30 ENCOUNTER — Other Ambulatory Visit: Payer: Self-pay

## 2022-12-07 ENCOUNTER — Other Ambulatory Visit: Payer: Self-pay

## 2022-12-13 ENCOUNTER — Other Ambulatory Visit: Payer: Self-pay

## 2022-12-16 ENCOUNTER — Other Ambulatory Visit: Payer: Self-pay

## 2022-12-20 ENCOUNTER — Other Ambulatory Visit: Payer: Self-pay

## 2022-12-20 MED ORDER — EMPAGLIFLOZIN 10 MG PO TABS: 10.0000 mg | ORAL_TABLET | Freq: Every day | ORAL | 3 refills | Status: DC

## 2022-12-21 ENCOUNTER — Other Ambulatory Visit: Payer: Self-pay

## 2023-01-11 ENCOUNTER — Other Ambulatory Visit: Payer: Self-pay

## 2023-01-25 ENCOUNTER — Other Ambulatory Visit: Payer: Self-pay

## 2023-02-14 ENCOUNTER — Other Ambulatory Visit (HOSPITAL_COMMUNITY): Payer: Self-pay

## 2023-02-14 ENCOUNTER — Encounter: Payer: Self-pay | Admitting: Family

## 2023-02-14 ENCOUNTER — Ambulatory Visit: Payer: Medicare Other | Attending: Family | Admitting: Family

## 2023-02-14 VITALS — BP 94/49 | HR 93 | Wt 138.0 lb

## 2023-02-14 DIAGNOSIS — Z79899 Other long term (current) drug therapy: Secondary | ICD-10-CM | POA: Insufficient documentation

## 2023-02-14 DIAGNOSIS — R110 Nausea: Secondary | ICD-10-CM | POA: Diagnosis not present

## 2023-02-14 DIAGNOSIS — Z87891 Personal history of nicotine dependence: Secondary | ICD-10-CM | POA: Insufficient documentation

## 2023-02-14 DIAGNOSIS — D649 Anemia, unspecified: Secondary | ICD-10-CM | POA: Diagnosis not present

## 2023-02-14 DIAGNOSIS — K30 Functional dyspepsia: Secondary | ICD-10-CM | POA: Insufficient documentation

## 2023-02-14 DIAGNOSIS — I5032 Chronic diastolic (congestive) heart failure: Secondary | ICD-10-CM | POA: Insufficient documentation

## 2023-02-14 DIAGNOSIS — G629 Polyneuropathy, unspecified: Secondary | ICD-10-CM | POA: Insufficient documentation

## 2023-02-14 DIAGNOSIS — R0602 Shortness of breath: Secondary | ICD-10-CM | POA: Diagnosis present

## 2023-02-14 DIAGNOSIS — D509 Iron deficiency anemia, unspecified: Secondary | ICD-10-CM | POA: Diagnosis not present

## 2023-02-14 DIAGNOSIS — Z7984 Long term (current) use of oral hypoglycemic drugs: Secondary | ICD-10-CM | POA: Insufficient documentation

## 2023-02-14 DIAGNOSIS — I110 Hypertensive heart disease with heart failure: Secondary | ICD-10-CM | POA: Diagnosis not present

## 2023-02-14 DIAGNOSIS — I10 Essential (primary) hypertension: Secondary | ICD-10-CM | POA: Diagnosis not present

## 2023-02-14 NOTE — Patient Instructions (Addendum)
Stop spironolactone.    Draw BMP in the next 1-2 days and fax results to HF Clinic at [phone]

## 2023-02-14 NOTE — Progress Notes (Signed)
Advanced Heart Failure Clinic Note   PCP: Melonie Florida, NP @ ALF  Primary Cardiologist: None  HPI:  Ms [place] is a 89 y/o female with a history of HTN, anemia, subdural hematoma, previous tobacco use and chronic heart failure.   Admitted 04/15/22 due to dizziness and chest pain. Found to be in acute HF. Cardiology consult obtained. Initially given IV lasix with transition to oral diuretics. Head CT negative and mental status back to baseline. Metoprolol stopped due to orthostasis. PT evaluation done. Discharged after 6 days.   Echo 04/16/22: EF of 60-65% along with mild LVH, mildly elevated PA pressure of 44.2 mmHg and moderate MR.   She presents today for a HF follow-up visit with a chief complaint of minimal SOB with moderate exertion. Chronic in nature. Recovers quickly when sitting down. Has been having indigestion and intermittent nausea along with some dizziness. Having foot pain due to neuropathy. Daughter says that patient had quite a bit of diarrhea last week. Now taking iron supplements due to anemia. Denies chest pain, palpitations, abdominal distention, pedal edema or difficulty sleeping.   Participating in PT and tai chai etc.   ROS: All systems negative except as listed in HPI, PMH and Problem List.  SH:  Social History   Socioeconomic History   Marital status: Married    Spouse name: Not on file   Number of children: Not on file   Years of education: Not on file   Highest education level: Not on file  Occupational History   Not on file  Tobacco Use   Smoking status: Former    Types: Cigarettes   Smokeless tobacco: Never  Substance and Sexual Activity   Alcohol use: Not Currently   Drug use: Never   Sexual activity: Not Currently  Other Topics Concern   Not on file  Social History Narrative   ** Merged History Encounter **       Social Determinants of Health   Financial Resource Strain: Not on file  Food Insecurity: No Food Insecurity (04/16/2022)    Hunger Vital Sign    Worried About Running Out of Food in the Last Year: Never true    Ran Out of Food in the Last Year: Never true  Transportation Needs: No Transportation Needs (04/16/2022)   PRAPARE - Administrator, Civil Service (Medical): No    Lack of Transportation (Non-Medical): No  Physical Activity: Insufficiently Active (05/07/2019)   Received from [facility], [facility]   Exercise Vital Sign    Days of Exercise per Week: 3 days    Minutes of Exercise per Session: 10 min  Stress: No Stress Concern Present (05/07/2019)   Received from [facility], [facility]   [facility] of Occupational Health - Occupational Stress Questionnaire    Feeling of Stress : Not at all  Social Connections: Not on file  Intimate Partner Violence: Patient Declined (04/16/2022)   Humiliation, Afraid, Rape, and Kick questionnaire    Fear of Current or Ex-Partner: Patient declined    Emotionally Abused: Patient declined    Physically Abused: Patient declined    Sexually Abused: Patient declined    FH:  Family History  Problem Relation Age of Onset   Stroke Maternal Grandmother    Stroke Paternal Grandmother     Past Medical History:  Diagnosis Date   Anemia    CHF (congestive heart failure) (HCC)  Hypertension    Subdural hematoma (HCC)     Current Outpatient Medications  Medication Sig Dispense Refill   empagliflozin (JARDIANCE) 10 MG TABS tablet Take 1 tablet (10 mg total) by mouth daily before breakfast. 30 tablet 5   empagliflozin (JARDIANCE) 10 MG TABS tablet Take 1 tablet (10 mg total) by mouth daily before breakfast. 90 tablet 3   ferrous gluconate (FERGON) 324 MG tablet Take 1 tablet (324 mg total) by mouth daily with breakfast. 60 tablet 1   furosemide (LASIX) 20 MG tablet Take 1 tablet (20 mg total) by mouth daily. 90 tablet 3   metoprolol tartrate (LOPRESSOR) 25 MG tablet Take 0.5  tablets (12.5 mg total) by mouth 2 (two) times daily. 90 tablet 3   mirtazapine (REMERON) 7.5 MG tablet Take 7.5 mg by mouth at bedtime.     mirtazapine (REMERON) 7.5 MG tablet Take 1 tablet (7.5 mg total) by mouth at bedtime for depression. 90 tablet 4   pantoprazole (PROTONIX) 40 MG tablet Take 40 mg by mouth daily.     sacubitril-valsartan (ENTRESTO) 24-26 MG Take 1 tablet by mouth 2 (two) times daily. 60 tablet 3   spironolactone (ALDACTONE) 25 MG tablet Take 1 tablet (25 mg total) by mouth daily. 90 tablet 3   No current facility-administered medications for this visit.   Vitals:   02/14/23 1402  BP: (!) 94/49  Pulse: 93  SpO2: 98%  Weight: 138 lb (62.6 kg)   Wt Readings from Last 3 Encounters:  02/14/23 138 lb (62.6 kg)  11/12/22 140 lb 12.8 oz (63.9 kg)  10/13/22 143 lb 2 oz (64.9 kg)   Lab Results  Component Value Date   CREATININE 0.52 04/21/2022   CREATININE 0.54 04/20/2022   CREATININE 0.50 04/19/2022   PHYSICAL EXAM:  General:  Well appearing. No resp difficulty HEENT: normal Neck: supple. JVP flat. No lymphadenopathy or thryomegaly appreciated. Cor: PMI normal. Regular rate & rhythm. No rubs, gallops or murmurs. Lungs: clear Abdomen: soft, nontender, nondistended. No hepatosplenomegaly. No bruits or masses.  Extremities: no cyanosis, clubbing, rash, edema Neuro: alert & oriented x3, cranial nerves grossly intact. Moves all 4 extremities w/o difficulty. Affect pleasant.   ECG: not done   ASSESSMENT & PLAN:  1: Chronic heart failure with preserved ejection fraction with LVH- - suspect due to HTN - NYHA class II - euvolemic today - weighing daily; reminded to call for an overnight weight gain of > 2 pounds or a weekly weight gain of > 5 pounds - weight down 2 pounds from last visit here 3 months ago - Echo 04/16/22: EF of 60-65% along with mild LVH, mildly elevated PA pressure of 44.2 mmHg and moderate MR.  - not adding salt to her food - uses her walker  for stability; currently has PT working with her - continue jardiance 10mg  daily - continue furosemide 20mg  daily - continue entresto 24/26mg  BID - hold spironolactone 25mg  due to hypotension - BNP 04/15/22 was 499.5  2: HTN- - BP 94/49; daughter will confirm that BP is being checked daily - wonder if patient is a little dehydrated due to diarrhea last week - hold spironolactone - BMP at ALF in next 1-2 days with results to be faxed to the HF clinic - sees PCP [name] at [facility] & have asked daughter to make sure that PCP sees patient next week - BMP 08/19/22 reviewed and showed sodium 134, potassium 4.7, creatinine 0.76 and GFR 74.85  3: Anemia- - hemoglobin on  11/13/22 was 9.0 - currently taking fergon  Return in 1 month, sooner if needed.

## 2023-02-22 ENCOUNTER — Other Ambulatory Visit: Payer: Self-pay

## 2023-02-22 ENCOUNTER — Other Ambulatory Visit: Payer: Self-pay | Admitting: Family

## 2023-02-23 ENCOUNTER — Other Ambulatory Visit: Payer: Self-pay

## 2023-02-28 ENCOUNTER — Other Ambulatory Visit: Payer: Self-pay

## 2023-03-03 ENCOUNTER — Other Ambulatory Visit: Payer: Self-pay

## 2023-03-09 ENCOUNTER — Other Ambulatory Visit: Payer: Self-pay

## 2023-03-15 ENCOUNTER — Telehealth: Payer: Self-pay

## 2023-03-15 NOTE — Telephone Encounter (Signed)
Fax received from [facility]> [address], [address] / [place], [place] [address]/GEX# [phone].   Lab results-purpose of continuity of care  From Amber Shepherd,LPN  CMP & CBCw diff. Scanned into media.

## 2023-03-17 NOTE — Progress Notes (Deleted)
Advanced Heart Failure Clinic Note   PCP: Melonie Florida, NP @ ALF  Primary Cardiologist: None  HPI:  [name] is a 89 y/o female with a history of HTN, anemia, subdural hematoma, previous tobacco use and chronic heart failure.   Admitted 04/15/22 due to dizziness and chest pain. Found to be in acute HF. Cardiology consult obtained. Initially given IV lasix with transition to oral diuretics. Head CT negative and mental status back to baseline. Metoprolol stopped due to orthostasis. PT evaluation done. Discharged after 6 days.   Echo 04/16/22: EF of 60-65% along with mild LVH, mildly elevated PA pressure of 44.2 mmHg and moderate MR.   She presents today for a HF follow-up visit with a chief complaint of   Participating in PT and tai chai etc.   At last visit, spironolactone was held due to hypotension.   ROS: All systems negative except as listed in HPI, PMH and Problem List.  SH:  Social History   Socioeconomic History   Marital status: Married    Spouse name: Not on file   Number of children: Not on file   Years of education: Not on file   Highest education level: Not on file  Occupational History   Not on file  Tobacco Use   Smoking status: Former    Types: Cigarettes   Smokeless tobacco: Never  Substance and Sexual Activity   Alcohol use: Not Currently   Drug use: Never   Sexual activity: Not Currently  Other Topics Concern   Not on file  Social History Narrative   ** Merged History Encounter **       Social Determinants of Health   Financial Resource Strain: Not on file  Food Insecurity: No Food Insecurity (04/16/2022)   Hunger Vital Sign    Worried About Running Out of Food in the Last Year: Never true    Ran Out of Food in the Last Year: Never true  Transportation Needs: No Transportation Needs (04/16/2022)   PRAPARE - Administrator, Civil Service (Medical): No    Lack of Transportation (Non-Medical): No  Physical Activity:  Insufficiently Active (05/07/2019)   Received from [facility], [facility]   Exercise Vital Sign    Days of Exercise per Week: 3 days    Minutes of Exercise per Session: 10 min  Stress: No Stress Concern Present (05/07/2019)   Received from [facility], [facility]   [employer] of Occupational Health - Occupational Stress Questionnaire    Feeling of Stress : Not at all  Social Connections: Not on file  Intimate Partner Violence: Patient Declined (04/16/2022)   Humiliation, Afraid, Rape, and Kick questionnaire    Fear of Current or Ex-Partner: Patient declined    Emotionally Abused: Patient declined    Physically Abused: Patient declined    Sexually Abused: Patient declined    FH:  Family History  Problem Relation Age of Onset   Stroke Maternal Grandmother    Stroke Paternal Grandmother     Past Medical History:  Diagnosis Date   Anemia    CHF (congestive heart failure) (HCC)    Hypertension    Subdural hematoma (HCC)     Current Outpatient Medications  Medication Sig Dispense Refill   empagliflozin (JARDIANCE) 10 MG TABS tablet Take 1 tablet (10 mg total) by mouth daily before breakfast. 90 tablet 3   ferrous gluconate (FERGON) 324 MG tablet  Take 1 tablet (324 mg total) by mouth daily with breakfast. 60 tablet 1   furosemide (LASIX) 20 MG tablet Take 1 tablet (20 mg total) by mouth daily. 90 tablet 3   metoprolol tartrate (LOPRESSOR) 25 MG tablet Take 0.5 tablets (12.5 mg total) by mouth 2 (two) times daily. 90 tablet 3   mirtazapine (REMERON) 7.5 MG tablet Take 7.5 mg by mouth at bedtime.     pantoprazole (PROTONIX) 40 MG tablet Take 40 mg by mouth daily.     sacubitril-valsartan (ENTRESTO) 24-26 MG Take 1 tablet by mouth 2 (two) times daily. 60 tablet 3   spironolactone (ALDACTONE) 25 MG tablet Take 1 tablet (25 mg total) by mouth daily. 90 tablet 3   No current facility-administered  medications for this visit.     PHYSICAL EXAM:  General:  Well appearing. No resp difficulty HEENT: normal Neck: supple. JVP flat. No lymphadenopathy or thryomegaly appreciated. Cor: PMI normal. Regular rate & rhythm. No rubs, gallops or murmurs. Lungs: clear Abdomen: soft, nontender, nondistended. No hepatosplenomegaly. No bruits or masses.  Extremities: no cyanosis, clubbing, rash, edema Neuro: alert & oriented x3, cranial nerves grossly intact. Moves all 4 extremities w/o difficulty. Affect pleasant.   ECG: not done   ASSESSMENT & PLAN:  1: Chronic heart failure with preserved ejection fraction with LVH- - suspect due to HTN - NYHA class II - euvolemic today - weighing daily; reminded to call for an overnight weight gain of > 2 pounds or a weekly weight gain of > 5 pounds - weight down 2 pounds from last visit here 3 months ago - Echo 04/16/22: EF of 60-65% along with mild LVH, mildly elevated PA pressure of 44.2 mmHg and moderate MR.  - not adding salt to her food - uses her walker for stability; currently has PT working with her - continue jardiance 10mg  daily - continue furosemide 20mg  daily - continue entresto 24/26mg  BID - hold spironolactone 25mg  due to hypotension - BNP 04/15/22 was 499.5  2: HTN- - BP  - sees PCP Mallory Oconnor) at [facility]  - BMP 02/22/23 (@ ALF) showed sodium 137, potassium 4.8, creatinine 0.97 and GFR 55.7  3: Anemia- - hemoglobin on 02/22/23 was 9.1 - currently taking fergon

## 2023-03-18 ENCOUNTER — Telehealth: Payer: Self-pay | Admitting: Family

## 2023-03-18 ENCOUNTER — Encounter: Payer: PRIVATE HEALTH INSURANCE | Admitting: Family

## 2023-03-18 NOTE — Telephone Encounter (Signed)
Patient did not show for her Heart Failure Clinic appointment on 03/18/23

## 2023-03-23 ENCOUNTER — Other Ambulatory Visit: Payer: Self-pay

## 2023-04-02 ENCOUNTER — Other Ambulatory Visit: Payer: Self-pay

## 2023-04-03 ENCOUNTER — Other Ambulatory Visit: Payer: Self-pay

## 2023-04-04 ENCOUNTER — Other Ambulatory Visit: Payer: Self-pay

## 2023-04-05 ENCOUNTER — Other Ambulatory Visit: Payer: Self-pay

## 2023-04-05 MED ORDER — FERROUS SULFATE 325 (65 FE) MG PO TABS
325.0000 mg | ORAL_TABLET | ORAL | 2 refills | Status: DC
  Filled 2023-04-05: qty 45, 90d supply, fill #0

## 2023-04-07 ENCOUNTER — Other Ambulatory Visit: Payer: Self-pay

## 2023-04-07 MED ORDER — METOPROLOL TARTRATE 25 MG PO TABS: 12.5000 mg | ORAL_TABLET | Freq: Two times a day (BID) | ORAL | 11 refills | Status: DC

## 2023-04-07 MED ORDER — CAPSAICIN 0.025 % EX CREA: TOPICAL_CREAM | CUTANEOUS | 11 refills | Status: DC

## 2023-04-07 MED ORDER — DAPAGLIFLOZIN PROPANEDIOL 10 MG PO TABS: 10.0000 mg | ORAL_TABLET | Freq: Every day | ORAL | 5 refills | Status: DC

## 2023-04-07 MED ORDER — SPIRONOLACTONE 25 MG PO TABS: 25.0000 mg | ORAL_TABLET | Freq: Every day | ORAL | 11 refills | Status: DC

## 2023-04-07 MED ORDER — FUROSEMIDE 20 MG PO TABS: 20.0000 mg | ORAL_TABLET | Freq: Every day | ORAL | 11 refills | Status: DC

## 2023-04-07 MED ORDER — IPRATROPIUM BROMIDE 0.03 % NA SOLN: 2.0000 | Freq: Two times a day (BID) | NASAL | 11 refills | Status: DC

## 2023-04-07 MED ORDER — CAPSAICIN 0.1 % EX CREA: 1.0000 | TOPICAL_CREAM | Freq: Three times a day (TID) | CUTANEOUS | 0 refills | Status: DC | PRN

## 2023-04-08 ENCOUNTER — Other Ambulatory Visit: Payer: Self-pay

## 2023-04-12 ENCOUNTER — Other Ambulatory Visit: Payer: Self-pay

## 2023-04-12 MED ORDER — MIRTAZAPINE 7.5 MG PO TABS
7.5000 mg | ORAL_TABLET | Freq: Every day | ORAL | 11 refills | Status: DC
  Filled 2023-04-12: qty 30, 30d supply, fill #0
  Filled 2023-05-19: qty 30, 30d supply, fill #1

## 2023-04-12 MED ORDER — PANTOPRAZOLE SODIUM 40 MG PO TBEC
40.0000 mg | DELAYED_RELEASE_TABLET | Freq: Every day | ORAL | 3 refills | Status: DC
  Filled 2023-04-12: qty 90, 90d supply, fill #0

## 2023-04-14 ENCOUNTER — Other Ambulatory Visit: Payer: Self-pay

## 2023-04-14 ENCOUNTER — Other Ambulatory Visit (HOSPITAL_BASED_OUTPATIENT_CLINIC_OR_DEPARTMENT_OTHER): Payer: Self-pay

## 2023-04-15 ENCOUNTER — Other Ambulatory Visit (HOSPITAL_COMMUNITY): Payer: Self-pay

## 2023-04-15 MED ORDER — DOCUSATE SODIUM 100 MG PO CAPS
100.0000 mg | ORAL_CAPSULE | Freq: Every day | ORAL | 2 refills | Status: DC
  Filled 2023-04-15: qty 30, 30d supply, fill #0

## 2023-04-22 ENCOUNTER — Telehealth: Payer: Self-pay | Admitting: Family

## 2023-04-22 NOTE — Telephone Encounter (Signed)
Spoke with pt's daughter Hollice Gong) regarding questions if pt should not take spironolactone.  Per last visit with Clarisa Kindred , FNP on 02/14/23. Pt should Hold Spironolactone  and f/u with Inetta Fermo.  Pt did not come to f/u appt.  Pt aware, agreeable, and verbalized understanding.  Daughter stated: "Mom Just started hospice and she is weak right now. She is in special care and diet to see if she gets stronger. If she doesn't get stronger, she will call in 2 weeks to schedule an appt"

## 2023-04-24 ENCOUNTER — Other Ambulatory Visit: Payer: Self-pay

## 2023-04-24 ENCOUNTER — Emergency Department
Admission: EM | Admit: 2023-04-24 | Discharge: 2023-04-25 | Disposition: A | Discharge status: Home / Self Care | Attending: Emergency Medicine | Admitting: Emergency Medicine

## 2023-04-24 DIAGNOSIS — B9689 Other specified bacterial agents as the cause of diseases classified elsewhere: Secondary | ICD-10-CM | POA: Diagnosis not present

## 2023-04-24 DIAGNOSIS — I5031 Acute diastolic (congestive) heart failure: Secondary | ICD-10-CM | POA: Insufficient documentation

## 2023-04-24 DIAGNOSIS — N390 Urinary tract infection, site not specified: Secondary | ICD-10-CM | POA: Insufficient documentation

## 2023-04-24 DIAGNOSIS — I110 Hypertensive heart disease with heart failure: Secondary | ICD-10-CM | POA: Diagnosis not present

## 2023-04-24 DIAGNOSIS — W1811XA Fall from or off toilet without subsequent striking against object, initial encounter: Secondary | ICD-10-CM | POA: Diagnosis not present

## 2023-04-24 DIAGNOSIS — W19XXXA Unspecified fall, initial encounter: Secondary | ICD-10-CM

## 2023-04-24 DIAGNOSIS — Z79899 Other long term (current) drug therapy: Secondary | ICD-10-CM | POA: Insufficient documentation

## 2023-04-24 DIAGNOSIS — R82998 Other abnormal findings in urine: Secondary | ICD-10-CM | POA: Diagnosis present

## 2023-04-24 LAB — CBC
HCT: 29.9 % — ABNORMAL LOW (ref 36.0–46.0)
Hemoglobin: 8.7 g/dL — ABNORMAL LOW (ref 12.0–15.0)
MCH: 23.1 pg — ABNORMAL LOW (ref 26.0–34.0)
MCHC: 29.1 g/dL — ABNORMAL LOW (ref 30.0–36.0)
MCV: 79.5 fL — ABNORMAL LOW (ref 80.0–100.0)
Platelets: 464 10*3/uL — ABNORMAL HIGH (ref 150–400)
RBC: 3.76 MIL/uL — ABNORMAL LOW (ref 3.87–5.11)
RDW: 19.2 % — ABNORMAL HIGH (ref 11.5–15.5)
WBC: 10.5 10*3/uL (ref 4.0–10.5)
nRBC: 0 % (ref 0.0–0.2)

## 2023-04-24 LAB — URINALYSIS, ROUTINE W REFLEX MICROSCOPIC
Bacteria, UA: NONE SEEN
Bilirubin Urine: NEGATIVE
Glucose, UA: >=500 mg/dL — AB
Hgb urine dipstick: NEGATIVE
Ketones, ur: NEGATIVE mg/dL
Nitrite: NEGATIVE
Protein, ur: NEGATIVE mg/dL
Specific Gravity, Urine: 1.027 (ref 1.005–1.030)
pH: 5 (ref 5.0–8.0)

## 2023-04-24 LAB — BASIC METABOLIC PANEL
Anion gap: 10 (ref 5–15)
BUN: 27 mg/dL — ABNORMAL HIGH (ref 8–23)
CO2: 26 mmol/L (ref 22–32)
Calcium: 9.3 mg/dL (ref 8.9–10.3)
Chloride: 100 mmol/L (ref 98–111)
Creatinine, Ser: 0.86 mg/dL (ref 0.44–1.00)
GFR, Estimated: >60 mL/min (ref >60)
Glucose, Bld: 287 mg/dL — ABNORMAL HIGH (ref 70–99)
Potassium: 3.9 mmol/L (ref 3.5–5.1)
Sodium: 136 mmol/L (ref 135–145)

## 2023-04-24 MED ORDER — FOSFOMYCIN TROMETHAMINE 3 G PO PACK
3.0000 g | PACK | Freq: Once | ORAL | Status: AC
  Administered 2023-04-24: 3 g via ORAL
  Filled 2023-04-24: qty 3

## 2023-04-24 NOTE — Discharge Instructions (Signed)
Return to the ER for worsening symptoms, persistent vomiting, difficulty breathing or other concerns.

## 2023-04-24 NOTE — ED Provider Notes (Signed)
[facility] Provider Note    Event Date/Time   First MD Initiated Contact with Patient 04/24/23 2330     (approximate)   History   Near Syncope   HPI  Mallory Oconnor is a 89 y.o. female brought to the ED via EMS from [facility] status post mechanical fall.  Patient is on hospice, was finishing using the toilet and bent over to pick up something from the floor when she lost her balance and fell.  Denies striking head or LOC.  Initially refused to come to the ED but hospice convinced her to come for evaluation.  Denies fever/chills, chest pain, shortness of breath, abdominal pain, nausea, vomiting or dizziness.     Past Medical History   Past Medical History:  Diagnosis Date   Anemia    CHF (congestive heart failure) (HCC)    Hypertension    Subdural hematoma [facility])      Active Problem List   Patient Active Problem List   Diagnosis Date Noted   Acute diastolic heart failure (HCC) 04/19/2022   CHF (congestive heart failure) (HCC) 04/17/2022   Falls 04/15/2022   Chest pain 04/15/2022   Anemia 04/15/2022   Subarachnoid hemorrhage (HCC)    Essential hypertension    Hyponatremia    AMS (altered mental status) 06/30/2021   Subdural bleeding (HCC) 06/29/2021   Leukocytosis 06/29/2021   Subdural hematoma (HCC)    Fall at home, initial encounter    Seizure [facility])      Past Surgical History  No past surgical history on file.   Home Medications   Prior to Admission medications   Medication Sig Start Date End Date Taking? Authorizing Provider  empagliflozin (JARDIANCE) 10 MG TABS tablet Take 1 tablet (10 mg total) by mouth daily before breakfast. 12/18/22  Yes Clarisa Kindred A, FNP  ferrous gluconate (FERGON) 324 MG tablet Take 1 tablet (324 mg total) by mouth daily with breakfast. 04/21/22  Yes Hollice Espy, MD  furosemide (LASIX) 20 MG tablet Take 1 tablet (20 mg total) by mouth daily. 10/14/22  Yes Clarisa Kindred A, FNP  metoprolol  tartrate (LOPRESSOR) 25 MG tablet Take 0.5 tablets (12.5 mg total) by mouth 2 (two) times daily. 10/14/22  Yes Hackney, Inetta Fermo A, FNP  mirtazapine (REMERON) 7.5 MG tablet Take 1 tablet (7.5 mg total) by mouth at bedtime. 04/11/23  Yes   pantoprazole (PROTONIX) 40 MG tablet Take 1 tablet (40 mg total) by mouth daily. 04/12/23  Yes   sacubitril-valsartan (ENTRESTO) 24-26 MG Take 1 tablet by mouth 2 (two) times daily. 10/14/22  Yes Hackney, Tina A, FNP  capsaicin (ZOSTRIX) 0.025 % cream Apply topically as directed. 12/27/22     Capsaicin 0.1 % CREA Apply 1 Application topically 3 (three) times daily as needed. 12/27/22     dapagliflozin propanediol (FARXIGA) 10 MG TABS tablet Take 1 tablet (10 mg total) by mouth daily before breakfast. 09/15/22   Delma Freeze, FNP  docusate sodium (COLACE) 100 MG capsule Take 1 capsule (100 mg total) by mouth daily for bowel management 04/15/23     famotidine (PEPCID) 20 MG tablet Take 20 mg by mouth at bedtime. 03/08/23   [provider]  ferrous sulfate 325 (65 FE) MG tablet Take 1 tablet (325 mg total) by mouth every other day. Patient not taking: Reported on 04/25/2023 03/10/23     furosemide (LASIX) 20 MG tablet Take 1 tablet (20 mg total) by mouth daily. Patient not taking: Reported  on 04/25/2023 06/22/22     ipratropium (ATROVENT) 0.03 % nasal spray Place 2 sprays into both nostrils 2 (two) times daily. Patient not taking: Reported on 04/25/2023 06/22/22     spironolactone (ALDACTONE) 25 MG tablet Take 1 tablet (25 mg total) by mouth daily. Patient not taking: Reported on 04/25/2023 10/14/22   Delma Freeze, FNP     Allergies  Patient has no known allergies.   Family History   Family History  Problem Relation Age of Onset   Stroke Maternal Grandmother    Stroke Paternal Grandmother      Physical Exam  Triage Vital Signs: ED Triage Vitals  Encounter Vitals Group     BP 04/24/23 2036 (!) 120/53     Systolic BP Percentile --      Diastolic BP  Percentile --      Pulse Rate 04/24/23 2036 92     Resp 04/24/23 2036 16     Temp 04/24/23 2036 97.9 F (36.6 C)     Temp Source 04/24/23 2036 Axillary     SpO2 04/24/23 2036 94 %     Weight 04/24/23 2034 130 lb (59 kg)     Height 04/24/23 2034 5\' 4"  (1.626 m)     Head Circumference --      Peak Flow --      Pain Score 04/24/23 2034 0     Pain Loc --      Pain Education --      Exclude from Growth Chart --     Updated Vital Signs: BP (!) 120/56   Pulse 95   Temp 97.9 F (36.6 C) (Axillary)   Resp 19   Ht 5\' 4"  (1.626 m)   Wt 59 kg   SpO2 95%   BMI 22.31 kg/m    General: Awake, no distress.  CV:  RRR.  Good peripheral perfusion.  Resp:  Normal effort.  CTAB. Abd:  Nontender.  No distention.  Other:  Hard of hearing.  Alert and oriented to person and place which is patient's baseline.  CN II-XII grossly intact.  5/5 motor strength and sensation all extremities.  M AE x 4.   ED Results / Procedures / Treatments  Labs (all labs ordered are listed, but only abnormal results are displayed) Labs Reviewed  BASIC METABOLIC PANEL - Abnormal; Notable for the following components:      Result Value   Glucose, Bld 287 (*)    BUN 27 (*)    All other components within normal limits  CBC - Abnormal; Notable for the following components:   RBC 3.76 (*)    Hemoglobin 8.7 (*)    HCT 29.9 (*)    MCV 79.5 (*)    MCH 23.1 (*)    MCHC 29.1 (*)    RDW 19.2 (*)    Platelets 464 (*)    All other components within normal limits  URINALYSIS, ROUTINE W REFLEX MICROSCOPIC - Abnormal; Notable for the following components:   Color, Urine YELLOW (*)    APPearance HAZY (*)    Glucose, UA >=500 (*)    Leukocytes,Ua TRACE (*)    All other components within normal limits  TROPONIN I (HIGH SENSITIVITY)     EKG  ED ECG REPORT I, Bastion Bolger J, the attending physician, personally viewed and interpreted this ECG.   Date: 04/24/2023  EKG Time: 2047  Rate: 95  Rhythm: normal sinus  rhythm  Axis: RAD  Intervals:nonspecific intraventricular conduction delay  ST&T Change:  Nonspecific Stable from prior    RADIOLOGY None   Official radiology report(s): No results found.   PROCEDURES:  Critical Care performed: No  .1-3 Lead EKG Interpretation  Performed by: Irean Hong, MD Authorized by: Irean Hong, MD     Interpretation: normal     ECG rate:  90   ECG rate assessment: normal     Rhythm: sinus rhythm     Ectopy: none     Conduction: normal   Comments:     Patient placed on cardiac monitor to evaluate for arrhythmias    MEDICATIONS ORDERED IN ED: Medications  empagliflozin (JARDIANCE) tablet 10 mg (has no administration in time range)  famotidine (PEPCID) tablet 20 mg (has no administration in time range)  furosemide (LASIX) tablet 20 mg (has no administration in time range)  metoprolol tartrate (LOPRESSOR) tablet 12.5 mg (has no administration in time range)  mirtazapine (REMERON) tablet 7.5 mg (has no administration in time range)  pantoprazole (PROTONIX) EC tablet 40 mg (has no administration in time range)  sacubitril-valsartan (ENTRESTO) 24-26 mg per tablet (has no administration in time range)  fosfomycin (MONUROL) packet 3 g (3 g Oral Given 04/24/23 2345)  acetaminophen (TYLENOL) tablet 1,000 mg (1,000 mg Oral Given 04/25/23 0515)     IMPRESSION / MDM / ASSESSMENT AND PLAN / ED COURSE  I reviewed the triage vital signs and the nursing notes.                             89 year old female on hospice sent from [facility] status post mechanical fall from toilet.  Denies pain.  Daughter at bedside.  States initially patient's blood pressure was low at the facility which is why they sent her for evaluation.  Patient has been normotensive in the emergency department.  Denies complaints.  No clinical signs for sepsis.  Administer fosfomycin for mild UTI.  Strict return precautions given.  Patient and daughter verbalized understanding and agree  with plan of care.  Patient's presentation is most consistent with acute complicated illness / injury requiring diagnostic workup.  36 Patient daughter concern for safe discharge back to her independent living apartment at [facility].  She has been thinking of pursuing higher level of care for some time.  Patient has been normotensive in the ED since 8:30 PM with negative workup so far.  Will check troponin. Discussed case with hospitalist service who feels patient does not meet hospitalization criteria.  If troponin is negative, will hold patient for [facility] boarding and consult social work in the morning for higher level of care.  1610 Troponin is negative. Will place [facility] consult.  FINAL CLINICAL IMPRESSION(S) / ED DIAGNOSES   Final diagnoses:  Fall, initial encounter  Lower urinary tract infectious disease     Rx / DC Orders   ED Discharge Orders     None        Note:  This document was prepared using Dragon voice recognition software and may include unintentional dictation errors.   Irean Hong, MD 04/25/23 [phone]

## 2023-04-24 NOTE — ED Triage Notes (Signed)
Pt arrived via EMS from [facility]. Per EMS pt was dedicating on the toilet and wet to wipe herself and fell off. Pt refused to come to the ED however hospice convinced pt to come and be seen.

## 2023-04-25 LAB — TROPONIN I (HIGH SENSITIVITY): Troponin I (High Sensitivity): 11 ng/L (ref <18)

## 2023-04-25 MED ORDER — MIRTAZAPINE 15 MG PO TABS: 7.5000 mg | ORAL_TABLET | Freq: Every day | ORAL | Status: DC

## 2023-04-25 MED ORDER — SACUBITRIL-VALSARTAN 24-26 MG PO TABS
1.0000 | ORAL_TABLET | Freq: Two times a day (BID) | ORAL | Status: DC
  Filled 2023-04-25: qty 1

## 2023-04-25 MED ORDER — FUROSEMIDE 40 MG PO TABS: 20.0000 mg | ORAL_TABLET | Freq: Every day | ORAL | Status: DC

## 2023-04-25 MED ORDER — ACETAMINOPHEN 500 MG PO TABS
1000.0000 mg | ORAL_TABLET | Freq: Once | ORAL | Status: AC
  Administered 2023-04-25: 1000 mg via ORAL
  Filled 2023-04-25: qty 2

## 2023-04-25 MED ORDER — FAMOTIDINE 20 MG PO TABS: 20.0000 mg | ORAL_TABLET | Freq: Every day | ORAL | Status: DC

## 2023-04-25 MED ORDER — METOPROLOL TARTRATE 25 MG PO TABS: 12.5000 mg | ORAL_TABLET | Freq: Two times a day (BID) | ORAL | Status: DC

## 2023-04-25 MED ORDER — EMPAGLIFLOZIN 10 MG PO TABS
10.0000 mg | ORAL_TABLET | Freq: Every day | ORAL | Status: DC
  Filled 2023-04-25: qty 1

## 2023-04-25 MED ORDER — PANTOPRAZOLE SODIUM 40 MG PO TBEC: 40.0000 mg | DELAYED_RELEASE_TABLET | Freq: Every day | ORAL | Status: DC

## 2023-04-25 NOTE — Progress Notes (Signed)
[facility] Liaison note:   This patient is a current [facility] patient.  [facility]  will continue to follow for discharge disposition.    Please call for any hospice related questions or concerns.   [facility] Liaison [phone]

## 2023-04-27 ENCOUNTER — Other Ambulatory Visit (HOSPITAL_COMMUNITY): Payer: Self-pay

## 2023-05-19 ENCOUNTER — Other Ambulatory Visit: Payer: Self-pay

## 2023-05-23 ENCOUNTER — Other Ambulatory Visit: Payer: Self-pay

## 2023-05-23 MED ORDER — MELOXICAM 7.5 MG PO TABS
7.5000 mg | ORAL_TABLET | Freq: Every day | ORAL | 0 refills | Status: DC
  Filled 2023-05-23: qty 3, 3d supply, fill #0

## 2023-05-24 ENCOUNTER — Other Ambulatory Visit: Payer: Self-pay

## 2023-06-06 ENCOUNTER — Other Ambulatory Visit: Payer: Self-pay

## 2023-06-15 ENCOUNTER — Other Ambulatory Visit: Payer: Self-pay

## 2023-07-14 ENCOUNTER — Other Ambulatory Visit: Payer: Self-pay

## 2023-07-21 ENCOUNTER — Other Ambulatory Visit: Payer: Self-pay

## 2023-08-11 ENCOUNTER — Other Ambulatory Visit: Payer: Self-pay

## 2023-08-30 ENCOUNTER — Other Ambulatory Visit: Payer: Self-pay

## 2023-09-10 DEATH — deceased

## 2023-11-11 ENCOUNTER — Other Ambulatory Visit: Payer: Self-pay

## 2023-11-14 ENCOUNTER — Other Ambulatory Visit: Payer: Self-pay

## 2023-12-08 ENCOUNTER — Other Ambulatory Visit: Payer: Self-pay

## 2023-12-12 ENCOUNTER — Other Ambulatory Visit: Payer: Self-pay
